# Patient Record
Sex: Female | Born: 1955 | Race: White | Hispanic: No | Marital: Married | Smoking: Former smoker
Health system: Southern US, Community
[De-identification: ages and names within clinical notes are randomized; demographics above are authoritative.]

## PROBLEM LIST (undated history)

## (undated) DIAGNOSIS — E7800 Pure hypercholesterolemia, unspecified: Secondary | ICD-10-CM

## (undated) DIAGNOSIS — N200 Calculus of kidney: Secondary | ICD-10-CM

## (undated) DIAGNOSIS — T4145XA Adverse effect of unspecified anesthetic, initial encounter: Secondary | ICD-10-CM

## (undated) DIAGNOSIS — I10 Essential (primary) hypertension: Secondary | ICD-10-CM

## (undated) DIAGNOSIS — Z8719 Personal history of other diseases of the digestive system: Secondary | ICD-10-CM

## (undated) DIAGNOSIS — T8859XA Other complications of anesthesia, initial encounter: Secondary | ICD-10-CM

## (undated) DIAGNOSIS — M1990 Unspecified osteoarthritis, unspecified site: Secondary | ICD-10-CM

## (undated) DIAGNOSIS — R609 Edema, unspecified: Secondary | ICD-10-CM

## (undated) DIAGNOSIS — R519 Headache, unspecified: Secondary | ICD-10-CM

## (undated) DIAGNOSIS — G4730 Sleep apnea, unspecified: Secondary | ICD-10-CM

## (undated) DIAGNOSIS — R51 Headache: Secondary | ICD-10-CM

## (undated) DIAGNOSIS — C801 Malignant (primary) neoplasm, unspecified: Secondary | ICD-10-CM

## (undated) DIAGNOSIS — K219 Gastro-esophageal reflux disease without esophagitis: Secondary | ICD-10-CM

## (undated) HISTORY — PX: VAGINAL HYSTERECTOMY: SUR661

## (undated) HISTORY — PX: BACK SURGERY: SHX140

## (undated) HISTORY — PX: WISDOM TOOTH EXTRACTION: SHX21

## (undated) HISTORY — PX: APPENDECTOMY: SHX54

## (undated) HISTORY — PX: FOOT SURGERY: SHX648

## (undated) HISTORY — PX: KNEE ARTHROSCOPY W/ MENISCAL REPAIR: SHX1877

## (undated) HISTORY — PX: BLADDER SUSPENSION: SHX72

## (undated) HISTORY — PX: CHOLECYSTECTOMY: SHX55

## (undated) HISTORY — PX: CARDIAC CATHETERIZATION: SHX172

---

## 2014-04-26 ENCOUNTER — Other Ambulatory Visit: Payer: Self-pay | Admitting: Physician Assistant

## 2014-04-26 NOTE — H&P (Signed)
TOTAL KNEE ADMISSION H&P  Patient is being admitted for right total knee arthroplasty.  Subjective:  Chief Complaint:right knee pain.  HPI: Lindsay [name], 58 y.o. female, has a history of pain and functional disability in the right knee due to arthritis and has failed non-surgical conservative treatments for greater than 12 weeks to includeNSAID's and/or analgesics, weight reduction as appropriate and activity modification.  Onset of symptoms was gradual, starting 10 years ago with rapidlly worsening course since that time. The patient noted prior procedures on the knee to include  arthroscopy and menisectomy on the right knee(s).  Patient currently rates pain in the right knee(s) at 8 out of 10 with activity. Patient has night pain, worsening of pain with activity and weight bearing, pain that interferes with activities of daily living and joint swelling.  Patient has evidence of subchondral cysts, subchondral sclerosis and joint space narrowing by imaging studies. There is no active infection.  There are no active problems to display for this patient.  No past medical history on file.  No past surgical history on file.   (Not in a hospital admission) Allergies not on file  History  Substance Use Topics  . Smoking status: Not on file  . Smokeless tobacco: Not on file  . Alcohol Use: Not on file    No family history on file.   Review of Systems  Constitutional: Negative.   HENT: Negative.   Eyes: Negative.   Respiratory: Negative.   Cardiovascular: Negative.   Gastrointestinal: Negative.   Genitourinary: Negative.   Musculoskeletal: Positive for joint pain.  Skin: Negative.   Neurological: Negative.   Endo/Heme/Allergies: Negative.   Psychiatric/Behavioral: Negative.     Objective:  Physical Exam  Constitutional: She is oriented to person, place, and time. She appears well-developed and well-nourished.  HENT:  Head: Normocephalic and atraumatic.  Eyes: EOM are normal.  Pupils are equal, round, and reactive to light.  Neck: Normal range of motion. Neck supple.  Cardiovascular: Normal rate, regular rhythm and normal heart sounds.  Exam reveals no gallop and no friction rub.   No murmur heard. Respiratory: Effort normal and breath sounds normal. No respiratory distress. She has no wheezes. She has no rales.  GI: Soft. Bowel sounds are normal.  Musculoskeletal:  Exogenous obesity weighing in today at 280 pounds.  Ht. 5'6", blood pressure 149/106, P 82.  Examination of her right knee reveals range of motion to 95 degrees.  Moderate patellofemoral crepitus.  Exquisite tenderness to palpation medial greater than lateral joint line.  Varus stress on the right.  Negative log roll.  Positive straight leg raise bilaterally.  Ligaments are stable throughout.  She is neurovascularly intact distally.    Neurological: She is alert and oriented to person, place, and time.  Skin: Skin is warm and dry.  Psychiatric: She has a normal mood and affect. Her behavior is normal. Judgment and thought content normal.    Vital signs in last 24 hours: @VSRANGES @  Labs:   There is no height or weight on file to calculate BMI.   Imaging Review Plain radiographs demonstrate severe degenerative joint disease of the right knee(s). The overall alignment ismild varus. The bone quality appears to be fair for age and reported activity level.  Assessment/Plan:  End stage arthritis, right knee   The patient history, physical examination, clinical judgment of the provider and imaging studies are consistent with end stage degenerative joint disease of the right knee(s) and total knee arthroplasty is deemed medically necessary.  The treatment options including medical management, injection therapy arthroscopy and arthroplasty were discussed at length. The risks and benefits of total knee arthroplasty were presented and reviewed. The risks due to aseptic loosening, infection, stiffness, patella  tracking problems, thromboembolic complications and other imponderables were discussed. The patient acknowledged the explanation, agreed to proceed with the plan and consent was signed. Patient is being admitted for inpatient treatment for surgery, pain control, PT, OT, prophylactic antibiotics, VTE prophylaxis, progressive ambulation and ADL's and discharge planning. The patient is planning to be discharged home with home health services

## 2014-05-01 ENCOUNTER — Encounter (HOSPITAL_COMMUNITY): Payer: Self-pay | Admitting: Pharmacy Technician

## 2014-05-06 ENCOUNTER — Encounter (HOSPITAL_COMMUNITY)
Admission: RE | Admit: 2014-05-06 | Discharge: 2014-05-06 | Disposition: A | Discharge status: Home / Self Care | Payer: Managed Care, Other (non HMO) | Source: Ambulatory Visit | Attending: Orthopedic Surgery | Admitting: Orthopedic Surgery

## 2014-05-06 ENCOUNTER — Other Ambulatory Visit (HOSPITAL_COMMUNITY): Payer: Self-pay | Admitting: *Deleted

## 2014-05-06 ENCOUNTER — Encounter (HOSPITAL_COMMUNITY): Payer: Self-pay

## 2014-05-06 ENCOUNTER — Encounter (HOSPITAL_COMMUNITY)
Admission: RE | Admit: 2014-05-06 | Discharge: 2014-05-06 | Disposition: A | Discharge status: Home / Self Care | Payer: Managed Care, Other (non HMO) | Source: Ambulatory Visit | Attending: Physician Assistant | Admitting: Physician Assistant

## 2014-05-06 DIAGNOSIS — Z01818 Encounter for other preprocedural examination: Secondary | ICD-10-CM | POA: Diagnosis not present

## 2014-05-06 DIAGNOSIS — M179 Osteoarthritis of knee, unspecified: Secondary | ICD-10-CM | POA: Insufficient documentation

## 2014-05-06 HISTORY — DX: Calculus of kidney: N20.0

## 2014-05-06 HISTORY — DX: Gastro-esophageal reflux disease without esophagitis: K21.9

## 2014-05-06 HISTORY — DX: Sleep apnea, unspecified: G47.30

## 2014-05-06 HISTORY — DX: Other complications of anesthesia, initial encounter: T88.59XA

## 2014-05-06 HISTORY — DX: Essential (primary) hypertension: I10

## 2014-05-06 HISTORY — DX: Personal history of other diseases of the digestive system: Z87.19

## 2014-05-06 HISTORY — DX: Headache: R51

## 2014-05-06 HISTORY — DX: Unspecified osteoarthritis, unspecified site: M19.90

## 2014-05-06 HISTORY — DX: Adverse effect of unspecified anesthetic, initial encounter: T41.45XA

## 2014-05-06 HISTORY — DX: Malignant (primary) neoplasm, unspecified: C80.1

## 2014-05-06 HISTORY — DX: Edema, unspecified: R60.9

## 2014-05-06 HISTORY — DX: Pure hypercholesterolemia, unspecified: E78.00

## 2014-05-06 HISTORY — DX: Headache, unspecified: R51.9

## 2014-05-06 LAB — URINALYSIS, ROUTINE W REFLEX MICROSCOPIC
Bilirubin Urine: NEGATIVE
Glucose, UA: NEGATIVE mg/dL
Ketones, ur: NEGATIVE mg/dL
NITRITE: NEGATIVE
PH: 7 (ref 5.0–8.0)
Protein, ur: NEGATIVE mg/dL
Specific Gravity, Urine: 1.016 (ref 1.005–1.030)
Urobilinogen, UA: 0.2 mg/dL (ref 0.0–1.0)

## 2014-05-06 LAB — PROTIME-INR
INR: 1.04 (ref 0.00–1.49)
Prothrombin Time: 13.7 seconds (ref 11.6–15.2)

## 2014-05-06 LAB — CBC WITH DIFFERENTIAL/PLATELET
BASOS ABS: 0.1 10*3/uL (ref 0.0–0.1)
Basophils Relative: 1 % (ref 0–1)
Eosinophils Absolute: 0.1 10*3/uL (ref 0.0–0.7)
Eosinophils Relative: 1 % (ref 0–5)
HCT: 42.2 % (ref 36.0–46.0)
Hemoglobin: 13.6 g/dL (ref 12.0–15.0)
Lymphocytes Relative: 35 % (ref 12–46)
Lymphs Abs: 3.4 10*3/uL (ref 0.7–4.0)
MCH: 28.1 pg (ref 26.0–34.0)
MCHC: 32.2 g/dL (ref 30.0–36.0)
MCV: 87.2 fL (ref 78.0–100.0)
Monocytes Absolute: 0.7 10*3/uL (ref 0.1–1.0)
Monocytes Relative: 7 % (ref 3–12)
NEUTROS ABS: 5.5 10*3/uL (ref 1.7–7.7)
NEUTROS PCT: 56 % (ref 43–77)
PLATELETS: 392 10*3/uL (ref 150–400)
RBC: 4.84 MIL/uL (ref 3.87–5.11)
RDW: 13.8 % (ref 11.5–15.5)
WBC: 9.7 10*3/uL (ref 4.0–10.5)

## 2014-05-06 LAB — COMPREHENSIVE METABOLIC PANEL
ALK PHOS: 124 U/L — AB (ref 39–117)
ALT: 15 U/L (ref 0–35)
AST: 17 U/L (ref 0–37)
Albumin: 3.5 g/dL (ref 3.5–5.2)
Anion gap: 13 (ref 5–15)
BILIRUBIN TOTAL: 0.3 mg/dL (ref 0.3–1.2)
BUN: 14 mg/dL (ref 6–23)
CALCIUM: 9.4 mg/dL (ref 8.4–10.5)
CHLORIDE: 99 meq/L (ref 96–112)
CO2: 26 mEq/L (ref 19–32)
Creatinine, Ser: 0.69 mg/dL (ref 0.50–1.10)
GFR calc Af Amer: >90 mL/min (ref >90)
GLUCOSE: 105 mg/dL — AB (ref 70–99)
POTASSIUM: 4.5 meq/L (ref 3.7–5.3)
SODIUM: 138 meq/L (ref 137–147)
Total Protein: 7.9 g/dL (ref 6.0–8.3)

## 2014-05-06 LAB — APTT: APTT: 28 s (ref 24–37)

## 2014-05-06 LAB — SURGICAL PCR SCREEN
MRSA, PCR: NEGATIVE
STAPHYLOCOCCUS AUREUS: POSITIVE — AB

## 2014-05-06 LAB — TYPE AND SCREEN
ABO/RH(D): O POS
Antibody Screen: NEGATIVE

## 2014-05-06 LAB — URINE MICROSCOPIC-ADD ON

## 2014-05-06 LAB — ABO/RH: ABO/RH(D): O POS

## 2014-05-06 NOTE — Progress Notes (Signed)
Mupirocin Ointment Rx called into [facility] [address] [place] for positive PCR of Staph. Pt notified and voiced understanding.

## 2014-05-06 NOTE — Pre-Procedure Instructions (Signed)
Lindsay [name]  05/06/2014   Your procedure is scheduled on:  Wednesday, May 15, 2014 at 10:45 AM.   Report to [facility] Entrance "A" Admitting Office at 8:45 AM.   Call this number if you have problems the morning of surgery: [phone]   Remember:   Do not eat food or drink liquids after midnight Tuesday, 05/14/14.   Take these medicines the morning of surgery with A SIP OF WATER: esomeprazole (Odin)   Stop Aspirin as of Wednesday, 05/08/14.    Do not wear jewelry, make-up or nail polish.  Do not wear lotions, powders, or perfumes. You may wear deodorant.  Do not shave 48 hours prior to surgery.   Do not bring valuables to the hospital.  [facility] is not responsible                  for any belongings or valuables.               Contacts, dentures or bridgework may not be worn into surgery.  Leave suitcase in the car. After surgery it may be brought to your room.  For patients admitted to the hospital, discharge time is determined by your                treatment team.               Special Instructions: [place] - Preparing for Surgery  Before surgery, you can play an important role.  Because skin is not sterile, your skin needs to be as free of germs as possible.  You can reduce the number of germs on you skin by washing with CHG (chlorahexidine gluconate) soap before surgery.  CHG is an antiseptic cleaner which kills germs and bonds with the skin to continue killing germs even after washing.  Please DO NOT use if you have an allergy to CHG or antibacterial soaps.  If your skin becomes reddened/irritated stop using the CHG and inform your nurse when you arrive at Short Stay.  Do not shave (including legs and underarms) for at least 48 hours prior to the first CHG shower.  You may shave your face.  Please follow these instructions carefully:   1.  Shower with CHG Soap the night before surgery and the                                morning of Surgery.  2.   If you choose to wash your hair, wash your hair first as usual with your       normal shampoo.  3.  After you shampoo, rinse your hair and body thoroughly to remove the                      Shampoo.  4.  Use CHG as you would any other liquid soap.  You can apply chg directly       to the skin and wash gently with scrungie or a clean washcloth.  5.  Apply the CHG Soap to your body ONLY FROM THE NECK DOWN.        Do not use on open wounds or open sores.  Avoid contact with your eyes, ears, mouth and genitals (private parts).  Wash genitals (private parts) with your normal soap.  6.  Wash thoroughly, paying special attention to the area where your surgery  will be performed.  7.  Thoroughly rinse your body with warm water from the neck down.  8.  DO NOT shower/wash with your normal soap after using and rinsing off       the CHG Soap.  9.  Pat yourself dry with a clean towel.            10.  Wear clean pajamas.            11.  Place clean sheets on your bed the night of your first shower and do not        sleep with pets.  Day of Surgery  Do not apply any lotions the morning of surgery.  Please wear clean clothes to the hospital.     Please read over the following fact sheets that you were given: Pain Booklet, Coughing and Deep Breathing, Blood Transfusion Information, MRSA Information and Surgical Site Infection Prevention

## 2014-05-06 NOTE — Progress Notes (Signed)
Pt states that last year, 03/23/14, she had an episode of "palpitations". States she went to [facility] ED and they did testing and she was told everything was ok. She states she's not had any "palpitations" since. Denies chest pain or sob. Requested EKG and any other cardiac studies from that visit from [facility]. Pt had a Nuclear Med stress test and EKG, both normal. Placed in chart.

## 2014-05-07 LAB — URINE CULTURE: Colony Count: >=100000

## 2014-05-14 MED ORDER — DEXTROSE 5 % IV SOLN
3.0000 g | INTRAVENOUS | Status: AC
  Administered 2014-05-15: 3 g via INTRAVENOUS
  Filled 2014-05-14: qty 3000

## 2014-05-14 MED ORDER — LACTATED RINGERS IV SOLN
INTRAVENOUS | Status: DC
  Administered 2014-05-15 (×2): via INTRAVENOUS

## 2014-05-14 NOTE — Anesthesia Preprocedure Evaluation (Signed)
Anesthesia Evaluation  Patient identified by MRN, date of birth, ID band Patient awake    Reviewed: Allergy & Precautions, H&P , NPO status , Patient's Chart, lab work & pertinent test results  Airway        Dental   Pulmonary former smoker (quit 1990),          Cardiovascular hypertension, Pt. on medications     Neuro/Psych    GI/Hepatic GERD-  Controlled,  Endo/Other    Renal/GU      Musculoskeletal   Abdominal   Peds  Hematology   Anesthesia Other Findings   Reproductive/Obstetrics                             Anesthesia Physical Anesthesia Plan  ASA: III  Anesthesia Plan: General   Post-op Pain Management: MAC Combined w/ Regional for Post-op pain   Induction: Intravenous  Airway Management Planned: Oral ETT  Additional Equipment:   Intra-op Plan:   Post-operative Plan: Extubation in OR  Informed Consent: I have reviewed the patients History and Physical, chart, labs and discussed the procedure including the risks, benefits and alternatives for the proposed anesthesia with the patient or authorized representative who has indicated his/her understanding and acceptance.     Plan Discussed with:   Anesthesia Plan Comments:         Anesthesia Quick Evaluation

## 2014-05-14 NOTE — Progress Notes (Signed)
Pt called confirming new arrival time

## 2014-05-15 ENCOUNTER — Encounter (HOSPITAL_COMMUNITY): Payer: Managed Care, Other (non HMO) | Admitting: Anesthesiology

## 2014-05-15 ENCOUNTER — Encounter (HOSPITAL_COMMUNITY)
Admission: RE | Disposition: A | Discharge status: Home Health | Payer: Self-pay | Source: Ambulatory Visit | Attending: Orthopedic Surgery

## 2014-05-15 ENCOUNTER — Inpatient Hospital Stay (HOSPITAL_COMMUNITY)
Admission: RE | Admit: 2014-05-15 | Discharge: 2014-05-17 | DRG: 470 | Disposition: A | Discharge status: Home Health | Payer: Managed Care, Other (non HMO) | Source: Ambulatory Visit | Attending: Orthopedic Surgery | Admitting: Orthopedic Surgery

## 2014-05-15 ENCOUNTER — Inpatient Hospital Stay (HOSPITAL_COMMUNITY): Payer: Managed Care, Other (non HMO)

## 2014-05-15 ENCOUNTER — Encounter (HOSPITAL_COMMUNITY): Payer: Self-pay | Admitting: *Deleted

## 2014-05-15 ENCOUNTER — Inpatient Hospital Stay (HOSPITAL_COMMUNITY): Payer: Managed Care, Other (non HMO) | Admitting: Anesthesiology

## 2014-05-15 DIAGNOSIS — M1711 Unilateral primary osteoarthritis, right knee: Secondary | ICD-10-CM

## 2014-05-15 DIAGNOSIS — Z96651 Presence of right artificial knee joint: Secondary | ICD-10-CM

## 2014-05-15 DIAGNOSIS — Z7982 Long term (current) use of aspirin: Secondary | ICD-10-CM

## 2014-05-15 DIAGNOSIS — Z79899 Other long term (current) drug therapy: Secondary | ICD-10-CM

## 2014-05-15 DIAGNOSIS — K219 Gastro-esophageal reflux disease without esophagitis: Secondary | ICD-10-CM | POA: Diagnosis present

## 2014-05-15 DIAGNOSIS — E780 Pure hypercholesterolemia: Secondary | ICD-10-CM | POA: Diagnosis present

## 2014-05-15 DIAGNOSIS — G4730 Sleep apnea, unspecified: Secondary | ICD-10-CM | POA: Diagnosis present

## 2014-05-15 DIAGNOSIS — M25561 Pain in right knee: Secondary | ICD-10-CM | POA: Diagnosis present

## 2014-05-15 DIAGNOSIS — Z96659 Presence of unspecified artificial knee joint: Secondary | ICD-10-CM

## 2014-05-15 DIAGNOSIS — I10 Essential (primary) hypertension: Secondary | ICD-10-CM | POA: Diagnosis present

## 2014-05-15 DIAGNOSIS — M1710 Unilateral primary osteoarthritis, unspecified knee: Secondary | ICD-10-CM | POA: Diagnosis present

## 2014-05-15 DIAGNOSIS — D62 Acute posthemorrhagic anemia: Secondary | ICD-10-CM | POA: Diagnosis not present

## 2014-05-15 DIAGNOSIS — M179 Osteoarthritis of knee, unspecified: Secondary | ICD-10-CM | POA: Diagnosis present

## 2014-05-15 HISTORY — PX: TOTAL KNEE ARTHROPLASTY: SHX125

## 2014-05-15 SURGERY — ARTHROPLASTY, KNEE, TOTAL
Anesthesia: General | Site: Knee | Laterality: Right

## 2014-05-15 MED ORDER — LIDOCAINE HCL (CARDIAC) 20 MG/ML IV SOLN
INTRAVENOUS | Status: DC | PRN
  Administered 2014-05-15: 100 mg via INTRAVENOUS

## 2014-05-15 MED ORDER — METHOCARBAMOL 1000 MG/10ML IJ SOLN
500.0000 mg | Freq: Four times a day (QID) | INTRAVENOUS | Status: DC | PRN
  Filled 2014-05-15: qty 5

## 2014-05-15 MED ORDER — GLYCOPYRROLATE 0.2 MG/ML IJ SOLN
INTRAMUSCULAR | Status: DC | PRN
  Administered 2014-05-15: 0.6 mg via INTRAVENOUS

## 2014-05-15 MED ORDER — PNEUMOCOCCAL VAC POLYVALENT 25 MCG/0.5ML IJ INJ
0.5000 mL | INJECTION | INTRAMUSCULAR | Status: AC
  Administered 2014-05-17: 0.5 mL via INTRAMUSCULAR
  Filled 2014-05-15: qty 0.5

## 2014-05-15 MED ORDER — CHLORHEXIDINE GLUCONATE 4 % EX LIQD
60.0000 mL | Freq: Once | CUTANEOUS | Status: AC
  Administered 2014-05-15: 4 via TOPICAL
  Filled 2014-05-15: qty 60

## 2014-05-15 MED ORDER — PANTOPRAZOLE SODIUM 40 MG PO TBEC
80.0000 mg | DELAYED_RELEASE_TABLET | Freq: Every day | ORAL | Status: DC
Start: 2014-05-15 — End: 2014-05-17
  Administered 2014-05-15 – 2014-05-16 (×2): 80 mg via ORAL
  Filled 2014-05-15 (×3): qty 2

## 2014-05-15 MED ORDER — ROCURONIUM BROMIDE 100 MG/10ML IV SOLN
INTRAVENOUS | Status: DC | PRN
  Administered 2014-05-15: 50 mg via INTRAVENOUS

## 2014-05-15 MED ORDER — PROMETHAZINE HCL 25 MG/ML IJ SOLN: 6.2500 mg | INTRAMUSCULAR | Status: DC | PRN

## 2014-05-15 MED ORDER — MIDAZOLAM HCL 5 MG/5ML IJ SOLN
INTRAMUSCULAR | Status: DC | PRN
  Administered 2014-05-15: 2 mg via INTRAVENOUS

## 2014-05-15 MED ORDER — ONDANSETRON HCL 4 MG/2ML IJ SOLN
INTRAMUSCULAR | Status: AC
  Filled 2014-05-15: qty 2

## 2014-05-15 MED ORDER — CEFAZOLIN SODIUM-DEXTROSE 2-3 GM-% IV SOLR
2.0000 g | Freq: Four times a day (QID) | INTRAVENOUS | Status: AC
  Administered 2014-05-15 (×2): 2 g via INTRAVENOUS
  Filled 2014-05-15 (×2): qty 50

## 2014-05-15 MED ORDER — DEXAMETHASONE SODIUM PHOSPHATE 4 MG/ML IJ SOLN
INTRAMUSCULAR | Status: DC | PRN
  Administered 2014-05-15: 4 mg via INTRAVENOUS

## 2014-05-15 MED ORDER — FENTANYL CITRATE 0.05 MG/ML IJ SOLN
INTRAMUSCULAR | Status: DC | PRN
  Administered 2014-05-15: 25 ug via INTRAVENOUS
  Administered 2014-05-15: 100 ug via INTRAVENOUS
  Administered 2014-05-15: 50 ug via INTRAVENOUS
  Administered 2014-05-15: 25 ug via INTRAVENOUS
  Administered 2014-05-15: 100 ug via INTRAVENOUS
  Administered 2014-05-15: 50 ug via INTRAVENOUS
  Administered 2014-05-15 (×2): 25 ug via INTRAVENOUS
  Administered 2014-05-15: 100 ug via INTRAVENOUS

## 2014-05-15 MED ORDER — GLYCOPYRROLATE 0.2 MG/ML IJ SOLN
INTRAMUSCULAR | Status: AC
Start: 2014-05-15 — End: 2014-05-15
  Filled 2014-05-15: qty 3

## 2014-05-15 MED ORDER — BUPIVACAINE LIPOSOME 1.3 % IJ SUSP
INTRAMUSCULAR | Status: DC | PRN
  Administered 2014-05-15: 20 mL

## 2014-05-15 MED ORDER — SODIUM CHLORIDE 0.9 % IR SOLN
Status: DC | PRN
  Administered 2014-05-15: 3000 mL

## 2014-05-15 MED ORDER — ASPIRIN EC 325 MG PO TBEC
325.0000 mg | DELAYED_RELEASE_TABLET | Freq: Every day | ORAL | Status: DC
  Administered 2014-05-16 – 2014-05-17 (×2): 325 mg via ORAL
  Filled 2014-05-15 (×3): qty 1

## 2014-05-15 MED ORDER — OXYCODONE HCL 5 MG PO TABS
ORAL_TABLET | ORAL | Status: AC
  Filled 2014-05-15: qty 2

## 2014-05-15 MED ORDER — PROPOFOL 10 MG/ML IV BOLUS
INTRAVENOUS | Status: AC
  Filled 2014-05-15: qty 20

## 2014-05-15 MED ORDER — ACETAMINOPHEN 10 MG/ML IV SOLN
INTRAVENOUS | Status: AC
  Administered 2014-05-15: 1000 mg
  Filled 2014-05-15: qty 100

## 2014-05-15 MED ORDER — METOCLOPRAMIDE HCL 10 MG PO TABS: 5.0000 mg | ORAL_TABLET | Freq: Three times a day (TID) | ORAL | Status: DC | PRN

## 2014-05-15 MED ORDER — DIPHENHYDRAMINE HCL 50 MG/ML IJ SOLN
INTRAMUSCULAR | Status: DC | PRN
  Administered 2014-05-15: 10 mg via INTRAVENOUS

## 2014-05-15 MED ORDER — BISACODYL 5 MG PO TBEC: 5.0000 mg | DELAYED_RELEASE_TABLET | Freq: Every day | ORAL | Status: DC | PRN

## 2014-05-15 MED ORDER — ACETAMINOPHEN 325 MG PO TABS
650.0000 mg | ORAL_TABLET | Freq: Four times a day (QID) | ORAL | Status: DC | PRN
  Administered 2014-05-16 – 2014-05-17 (×2): 650 mg via ORAL
  Filled 2014-05-15 (×2): qty 2

## 2014-05-15 MED ORDER — ARTIFICIAL TEARS OP OINT
TOPICAL_OINTMENT | OPHTHALMIC | Status: AC
  Filled 2014-05-15: qty 3.5

## 2014-05-15 MED ORDER — DOCUSATE SODIUM 100 MG PO CAPS
100.0000 mg | ORAL_CAPSULE | Freq: Two times a day (BID) | ORAL | Status: DC
  Administered 2014-05-15 – 2014-05-17 (×5): 100 mg via ORAL
  Filled 2014-05-15 (×5): qty 1

## 2014-05-15 MED ORDER — OXYCODONE HCL 5 MG PO TABS
5.0000 mg | ORAL_TABLET | ORAL | Status: DC | PRN
  Administered 2014-05-15 (×2): 10 mg via ORAL
  Administered 2014-05-15: 5 mg via ORAL
  Administered 2014-05-15 – 2014-05-17 (×11): 10 mg via ORAL
  Filled 2014-05-15 (×9): qty 2
  Filled 2014-05-15: qty 1
  Filled 2014-05-15 (×4): qty 2

## 2014-05-15 MED ORDER — SODIUM CHLORIDE 0.9 % IJ SOLN
INTRAMUSCULAR | Status: AC
  Filled 2014-05-15: qty 10

## 2014-05-15 MED ORDER — ROCURONIUM BROMIDE 50 MG/5ML IV SOLN
INTRAVENOUS | Status: AC
  Filled 2014-05-15: qty 1

## 2014-05-15 MED ORDER — FENTANYL CITRATE 0.05 MG/ML IJ SOLN
25.0000 ug | INTRAMUSCULAR | Status: DC | PRN
  Administered 2014-05-15 (×4): 50 ug via INTRAVENOUS

## 2014-05-15 MED ORDER — FENTANYL CITRATE 0.05 MG/ML IJ SOLN
INTRAMUSCULAR | Status: AC
  Filled 2014-05-15: qty 5

## 2014-05-15 MED ORDER — CELECOXIB 200 MG PO CAPS
200.0000 mg | ORAL_CAPSULE | Freq: Two times a day (BID) | ORAL | Status: DC
  Administered 2014-05-15 – 2014-05-17 (×5): 200 mg via ORAL
  Filled 2014-05-15 (×6): qty 1

## 2014-05-15 MED ORDER — POTASSIUM CHLORIDE IN NACL 20-0.9 MEQ/L-% IV SOLN
INTRAVENOUS | Status: DC
  Administered 2014-05-15 – 2014-05-16 (×2): via INTRAVENOUS
  Filled 2014-05-15 (×3): qty 1000

## 2014-05-15 MED ORDER — DEXAMETHASONE SODIUM PHOSPHATE 4 MG/ML IJ SOLN
INTRAMUSCULAR | Status: AC
  Filled 2014-05-15: qty 1

## 2014-05-15 MED ORDER — METHOCARBAMOL 500 MG PO TABS
500.0000 mg | ORAL_TABLET | Freq: Four times a day (QID) | ORAL | Status: DC | PRN
  Administered 2014-05-15 – 2014-05-17 (×7): 500 mg via ORAL
  Filled 2014-05-15 (×6): qty 1

## 2014-05-15 MED ORDER — LISINOPRIL-HYDROCHLOROTHIAZIDE 20-25 MG PO TABS: 1.0000 | ORAL_TABLET | Freq: Every day | ORAL | Status: DC

## 2014-05-15 MED ORDER — FENTANYL CITRATE 0.05 MG/ML IJ SOLN
INTRAMUSCULAR | Status: AC
  Filled 2014-05-15: qty 2

## 2014-05-15 MED ORDER — ONDANSETRON HCL 4 MG/2ML IJ SOLN
INTRAMUSCULAR | Status: DC | PRN
  Administered 2014-05-15: 4 mg via INTRAVENOUS

## 2014-05-15 MED ORDER — BISACODYL 5 MG PO TBEC: 5.0000 mg | DELAYED_RELEASE_TABLET | Freq: Every day | ORAL | Status: AC | PRN

## 2014-05-15 MED ORDER — SUCCINYLCHOLINE CHLORIDE 20 MG/ML IJ SOLN
INTRAMUSCULAR | Status: AC
  Filled 2014-05-15: qty 1

## 2014-05-15 MED ORDER — PHENYLEPHRINE HCL 10 MG/ML IJ SOLN
INTRAMUSCULAR | Status: DC | PRN
  Administered 2014-05-15 (×6): 80 ug via INTRAVENOUS

## 2014-05-15 MED ORDER — ONDANSETRON HCL 4 MG/2ML IJ SOLN: 4.0000 mg | Freq: Four times a day (QID) | INTRAMUSCULAR | Status: DC | PRN

## 2014-05-15 MED ORDER — MEPERIDINE HCL 25 MG/ML IJ SOLN: 6.2500 mg | INTRAMUSCULAR | Status: DC | PRN

## 2014-05-15 MED ORDER — METHOCARBAMOL 500 MG PO TABS: 500.0000 mg | ORAL_TABLET | Freq: Four times a day (QID) | ORAL | Status: AC

## 2014-05-15 MED ORDER — NEOSTIGMINE METHYLSULFATE 10 MG/10ML IV SOLN
INTRAVENOUS | Status: DC | PRN
  Administered 2014-05-15: 4 mg via INTRAVENOUS

## 2014-05-15 MED ORDER — ASPIRIN EC 325 MG PO TBEC: 325.0000 mg | DELAYED_RELEASE_TABLET | Freq: Every day | ORAL | Status: AC

## 2014-05-15 MED ORDER — FENTANYL CITRATE 0.05 MG/ML IJ SOLN
INTRAMUSCULAR | Status: AC
  Administered 2014-05-15: 50 ug via INTRAVENOUS
  Filled 2014-05-15: qty 2

## 2014-05-15 MED ORDER — METHOCARBAMOL 500 MG PO TABS
ORAL_TABLET | ORAL | Status: AC
  Filled 2014-05-15: qty 1

## 2014-05-15 MED ORDER — SODIUM CHLORIDE 0.9 % IJ SOLN
INTRAMUSCULAR | Status: DC | PRN
  Administered 2014-05-15: 40 mL

## 2014-05-15 MED ORDER — NEOSTIGMINE METHYLSULFATE 10 MG/10ML IV SOLN
INTRAVENOUS | Status: AC
  Filled 2014-05-15: qty 1

## 2014-05-15 MED ORDER — CHLORHEXIDINE GLUCONATE 4 % EX LIQD
60.0000 mL | Freq: Once | CUTANEOUS | Status: DC
Start: 2014-05-15 — End: 2014-05-15
  Filled 2014-05-15: qty 60

## 2014-05-15 MED ORDER — PHENOL 1.4 % MT LIQD: 1.0000 | OROMUCOSAL | Status: DC | PRN

## 2014-05-15 MED ORDER — INFLUENZA VAC SPLIT QUAD 0.5 ML IM SUSY
0.5000 mL | PREFILLED_SYRINGE | INTRAMUSCULAR | Status: DC
  Filled 2014-05-15: qty 0.5

## 2014-05-15 MED ORDER — EPHEDRINE SULFATE 50 MG/ML IJ SOLN
INTRAMUSCULAR | Status: AC
  Filled 2014-05-15: qty 1

## 2014-05-15 MED ORDER — ACETAMINOPHEN 650 MG RE SUPP: 650.0000 mg | Freq: Four times a day (QID) | RECTAL | Status: DC | PRN

## 2014-05-15 MED ORDER — OXYCODONE-ACETAMINOPHEN 5-325 MG PO TABS: 1.0000 | ORAL_TABLET | ORAL | Status: AC | PRN

## 2014-05-15 MED ORDER — ONDANSETRON HCL 4 MG PO TABS: 4.0000 mg | ORAL_TABLET | Freq: Four times a day (QID) | ORAL | Status: DC | PRN

## 2014-05-15 MED ORDER — HYDROCHLOROTHIAZIDE 25 MG PO TABS
25.0000 mg | ORAL_TABLET | Freq: Every day | ORAL | Status: DC
  Administered 2014-05-15 – 2014-05-17 (×3): 25 mg via ORAL
  Filled 2014-05-15 (×3): qty 1

## 2014-05-15 MED ORDER — PROPOFOL 10 MG/ML IV BOLUS
INTRAVENOUS | Status: DC | PRN
  Administered 2014-05-15 (×2): 20 mg via INTRAVENOUS
  Administered 2014-05-15: 120 mg via INTRAVENOUS
  Administered 2014-05-15: 30 mg via INTRAVENOUS

## 2014-05-15 MED ORDER — ZOLPIDEM TARTRATE 5 MG PO TABS
5.0000 mg | ORAL_TABLET | Freq: Every evening | ORAL | Status: DC | PRN
  Administered 2014-05-16: 5 mg via ORAL
  Filled 2014-05-15: qty 1

## 2014-05-15 MED ORDER — DIPHENHYDRAMINE HCL 50 MG/ML IJ SOLN
INTRAMUSCULAR | Status: AC
  Filled 2014-05-15: qty 1

## 2014-05-15 MED ORDER — METOCLOPRAMIDE HCL 5 MG/ML IJ SOLN: 5.0000 mg | Freq: Three times a day (TID) | INTRAMUSCULAR | Status: DC | PRN

## 2014-05-15 MED ORDER — PHENYLEPHRINE 40 MCG/ML (10ML) SYRINGE FOR IV PUSH (FOR BLOOD PRESSURE SUPPORT)
PREFILLED_SYRINGE | INTRAVENOUS | Status: AC
  Filled 2014-05-15: qty 10

## 2014-05-15 MED ORDER — LIDOCAINE HCL (CARDIAC) 20 MG/ML IV SOLN
INTRAVENOUS | Status: AC
  Filled 2014-05-15: qty 5

## 2014-05-15 MED ORDER — MENTHOL 3 MG MT LOZG: 1.0000 | LOZENGE | OROMUCOSAL | Status: DC | PRN

## 2014-05-15 MED ORDER — DIPHENHYDRAMINE HCL 12.5 MG/5ML PO ELIX: 12.5000 mg | ORAL_SOLUTION | ORAL | Status: DC | PRN

## 2014-05-15 MED ORDER — ONDANSETRON HCL 4 MG PO TABS: 4.0000 mg | ORAL_TABLET | Freq: Three times a day (TID) | ORAL | Status: AC | PRN

## 2014-05-15 MED ORDER — LISINOPRIL 20 MG PO TABS
20.0000 mg | ORAL_TABLET | Freq: Every day | ORAL | Status: DC
  Administered 2014-05-15 – 2014-05-17 (×3): 20 mg via ORAL
  Filled 2014-05-15 (×3): qty 1

## 2014-05-15 MED ORDER — MIDAZOLAM HCL 2 MG/2ML IJ SOLN
INTRAMUSCULAR | Status: AC
  Filled 2014-05-15: qty 2

## 2014-05-15 SURGICAL SUPPLY — 66 items
BANDAGE ELASTIC 4 VELCRO ST LF (GAUZE/BANDAGES/DRESSINGS) ×3 IMPLANT
BANDAGE ELASTIC 6 VELCRO ST LF (GAUZE/BANDAGES/DRESSINGS) ×3 IMPLANT
BANDAGE ESMARK 6X9 LF (GAUZE/BANDAGES/DRESSINGS) ×1 IMPLANT
BENZOIN TINCTURE PRP APPL 2/3 (GAUZE/BANDAGES/DRESSINGS) ×3 IMPLANT
BLADE SAG 18X100X1.27 (BLADE) ×6 IMPLANT
BNDG ESMARK 6X9 LF (GAUZE/BANDAGES/DRESSINGS) ×3
BOWL SMART MIX CTS (DISPOSABLE) ×3 IMPLANT
CEMENT BONE SIMPLEX SPEEDSET (Cement) ×6 IMPLANT
CLOSURE STERI-STRIP 1/4X4 (GAUZE/BANDAGES/DRESSINGS) ×3 IMPLANT
CLOSURE WOUND 1/2 X4 (GAUZE/BANDAGES/DRESSINGS) ×2
COVER SURGICAL LIGHT HANDLE (MISCELLANEOUS) ×3 IMPLANT
CUFF TOURNIQUET SINGLE 34IN LL (TOURNIQUET CUFF) ×3 IMPLANT
DRAPE EXTREMITY T 121X128X90 (DRAPE) ×3 IMPLANT
DRAPE PROXIMA HALF (DRAPES) ×3 IMPLANT
DRAPE U-SHAPE 47X51 STRL (DRAPES) ×3 IMPLANT
DRSG PAD ABDOMINAL 8X10 ST (GAUZE/BANDAGES/DRESSINGS) ×3 IMPLANT
DURAPREP 26ML APPLICATOR (WOUND CARE) ×6 IMPLANT
ELECT CAUTERY BLADE 6.4 (BLADE) ×3 IMPLANT
ELECT REM PT RETURN 9FT ADLT (ELECTROSURGICAL) ×3
ELECTRODE REM PT RTRN 9FT ADLT (ELECTROSURGICAL) ×1 IMPLANT
EVACUATOR 1/8 PVC DRAIN (DRAIN) ×3 IMPLANT
FACESHIELD WRAPAROUND (MASK) ×6 IMPLANT
GAUZE SPONGE 4X4 12PLY STRL (GAUZE/BANDAGES/DRESSINGS) ×3 IMPLANT
GLOVE BIOGEL PI IND STRL 7.0 (GLOVE) ×2 IMPLANT
GLOVE BIOGEL PI INDICATOR 7.0 (GLOVE) ×4
GLOVE ECLIPSE 6.5 STRL STRAW (GLOVE) ×6 IMPLANT
GLOVE ORTHO TXT STRL SZ7.5 (GLOVE) ×3 IMPLANT
GOWN STRL REUS W/ TWL LRG LVL3 (GOWN DISPOSABLE) ×1 IMPLANT
GOWN STRL REUS W/ TWL XL LVL3 (GOWN DISPOSABLE) ×1 IMPLANT
GOWN STRL REUS W/TWL LRG LVL3 (GOWN DISPOSABLE) ×2
GOWN STRL REUS W/TWL XL LVL3 (GOWN DISPOSABLE) ×2
HANDPIECE INTERPULSE COAX TIP (DISPOSABLE) ×2
IMMOBILIZER KNEE 22 UNIV (SOFTGOODS) ×3 IMPLANT
IMMOBILIZER KNEE 24 THIGH 36 (MISCELLANEOUS) IMPLANT
IMMOBILIZER KNEE 24 UNIV (MISCELLANEOUS)
KIT BASIN OR (CUSTOM PROCEDURE TRAY) ×3 IMPLANT
KIT ROOM TURNOVER OR (KITS) ×3 IMPLANT
KNEE/VIT E POLY LINER LEVEL 1B ×3 IMPLANT
MANIFOLD NEPTUNE II (INSTRUMENTS) ×3 IMPLANT
NEEDLE 18GX1X1/2 (RX/OR ONLY) (NEEDLE) ×3 IMPLANT
NEEDLE 25GX 5/8IN NON SAFETY (NEEDLE) ×3 IMPLANT
NS IRRIG 1000ML POUR BTL (IV SOLUTION) ×3 IMPLANT
PACK TOTAL JOINT (CUSTOM PROCEDURE TRAY) ×3 IMPLANT
PAD ARMBOARD 7.5X6 YLW CONV (MISCELLANEOUS) ×6 IMPLANT
PAD CAST 4YDX4 CTTN HI CHSV (CAST SUPPLIES) ×1 IMPLANT
PADDING CAST ABS 4INX4YD NS (CAST SUPPLIES) ×2
PADDING CAST ABS COTTON 4X4 ST (CAST SUPPLIES) ×1 IMPLANT
PADDING CAST COTTON 4X4 STRL (CAST SUPPLIES) ×2
PADDING CAST COTTON 6X4 STRL (CAST SUPPLIES) ×3 IMPLANT
SET HNDPC FAN SPRY TIP SCT (DISPOSABLE) ×1 IMPLANT
SPONGE GAUZE 4X4 12PLY STER LF (GAUZE/BANDAGES/DRESSINGS) ×3 IMPLANT
STRIP CLOSURE SKIN 1/2X4 (GAUZE/BANDAGES/DRESSINGS) ×4 IMPLANT
SUCTION FRAZIER TIP 10 FR DISP (SUCTIONS) ×3 IMPLANT
SUT ETHILON 2 0 FS 18 (SUTURE) ×3 IMPLANT
SUT MNCRL AB 4-0 PS2 18 (SUTURE) ×3 IMPLANT
SUT VIC AB 0 CT1 27 (SUTURE)
SUT VIC AB 0 CT1 27XBRD ANBCTR (SUTURE) IMPLANT
SUT VIC AB 1 CT1 27 (SUTURE) ×4
SUT VIC AB 1 CT1 27XBRD ANBCTR (SUTURE) ×2 IMPLANT
SUT VIC AB 2-0 CT1 27 (SUTURE) ×4
SUT VIC AB 2-0 CT1 TAPERPNT 27 (SUTURE) ×2 IMPLANT
SYR 50ML LL SCALE MARK (SYRINGE) ×3 IMPLANT
SYR CONTROL 10ML LL (SYRINGE) ×3 IMPLANT
TOWEL OR 17X24 6PK STRL BLUE (TOWEL DISPOSABLE) ×3 IMPLANT
TOWEL OR 17X26 10 PK STRL BLUE (TOWEL DISPOSABLE) ×3 IMPLANT
WATER STERILE IRR 1000ML POUR (IV SOLUTION) ×6 IMPLANT

## 2014-05-15 NOTE — H&P (View-Only) (Signed)
TOTAL KNEE ADMISSION H&P  Patient is being admitted for right total knee arthroplasty.  Subjective:  Chief Complaint:right knee pain.  HPI: Lindsay [name], 58 y.o. female, has a history of pain and functional disability in the right knee due to arthritis and has failed non-surgical conservative treatments for greater than 12 weeks to includeNSAID's and/or analgesics, weight reduction as appropriate and activity modification.  Onset of symptoms was gradual, starting 10 years ago with rapidlly worsening course since that time. The patient noted prior procedures on the knee to include  arthroscopy and menisectomy on the right knee(s).  Patient currently rates pain in the right knee(s) at 8 out of 10 with activity. Patient has night pain, worsening of pain with activity and weight bearing, pain that interferes with activities of daily living and joint swelling.  Patient has evidence of subchondral cysts, subchondral sclerosis and joint space narrowing by imaging studies. There is no active infection.  There are no active problems to display for this patient.  No past medical history on file.  No past surgical history on file.   (Not in a hospital admission) Allergies not on file  History  Substance Use Topics  . Smoking status: Not on file  . Smokeless tobacco: Not on file  . Alcohol Use: Not on file    No family history on file.   Review of Systems  Constitutional: Negative.   HENT: Negative.   Eyes: Negative.   Respiratory: Negative.   Cardiovascular: Negative.   Gastrointestinal: Negative.   Genitourinary: Negative.   Musculoskeletal: Positive for joint pain.  Skin: Negative.   Neurological: Negative.   Endo/Heme/Allergies: Negative.   Psychiatric/Behavioral: Negative.     Objective:  Physical Exam  Constitutional: She is oriented to person, place, and time. She appears well-developed and well-nourished.  HENT:  Head: Normocephalic and atraumatic.  Eyes: EOM are normal.  Pupils are equal, round, and reactive to light.  Neck: Normal range of motion. Neck supple.  Cardiovascular: Normal rate, regular rhythm and normal heart sounds.  Exam reveals no gallop and no friction rub.   No murmur heard. Respiratory: Effort normal and breath sounds normal. No respiratory distress. She has no wheezes. She has no rales.  GI: Soft. Bowel sounds are normal.  Musculoskeletal:  Exogenous obesity weighing in today at 280 pounds.  Ht. 5'6", blood pressure 149/106, P 82.  Examination of her right knee reveals range of motion to 95 degrees.  Moderate patellofemoral crepitus.  Exquisite tenderness to palpation medial greater than lateral joint line.  Varus stress on the right.  Negative log roll.  Positive straight leg raise bilaterally.  Ligaments are stable throughout.  She is neurovascularly intact distally.    Neurological: She is alert and oriented to person, place, and time.  Skin: Skin is warm and dry.  Psychiatric: She has a normal mood and affect. Her behavior is normal. Judgment and thought content normal.    Vital signs in last 24 hours: @VSRANGES @  Labs:   There is no height or weight on file to calculate BMI.   Imaging Review Plain radiographs demonstrate severe degenerative joint disease of the right knee(s). The overall alignment ismild varus. The bone quality appears to be fair for age and reported activity level.  Assessment/Plan:  End stage arthritis, right knee   The patient history, physical examination, clinical judgment of the provider and imaging studies are consistent with end stage degenerative joint disease of the right knee(s) and total knee arthroplasty is deemed medically necessary.  The treatment options including medical management, injection therapy arthroscopy and arthroplasty were discussed at length. The risks and benefits of total knee arthroplasty were presented and reviewed. The risks due to aseptic loosening, infection, stiffness, patella  tracking problems, thromboembolic complications and other imponderables were discussed. The patient acknowledged the explanation, agreed to proceed with the plan and consent was signed. Patient is being admitted for inpatient treatment for surgery, pain control, PT, OT, prophylactic antibiotics, VTE prophylaxis, progressive ambulation and ADL's and discharge planning. The patient is planning to be discharged home with home health services

## 2014-05-15 NOTE — Progress Notes (Signed)
RT note: CPAP set up in room for h/s use.

## 2014-05-15 NOTE — Interval H&P Note (Signed)
History and Physical Interval Note:  05/15/2014 8:31 AM  Lindsay [name]  has presented today for surgery, with the diagnosis of djd right knee  The various methods of treatment have been discussed with the patient and family. After consideration of risks, benefits and other options for treatment, the patient has consented to  Procedure(s): RIGHT TOTAL KNEE ARTHROPLASTY (Right) as a surgical intervention .  The patient's history has been reviewed, patient examined, no change in status, stable for surgery.  I have reviewed the patient's chart and labs.  Questions were answered to the patient's satisfaction.     [name] F

## 2014-05-15 NOTE — Evaluation (Signed)
Occupational Therapy Evaluation Patient Details Name: Lindsay [name] MRN: [id number] DOB: 04-21-1956 Today's Date: 05/15/2014    History of Present Illness 58 y.o. s/p Rt TKA.   Clinical Impression   Pt s/p above. Pt independent with ADLs, PTA. Feel pt will benefit from acute OT to increase independence with ADLs, PTA. Plan to practice tub transfer next session and LB ADLs.    Follow Up Recommendations  No OT follow up;Supervision - Intermittent    Equipment Recommendations  3 in 1 bedside comode;Tub/shower seat;Other (comment) (FLAT shower seat)    Recommendations for Other Services       Precautions / Restrictions Precautions Precautions: Fall;Knee Precaution Booklet Issued: No Precaution Comments: educated on precautions Required Braces or Orthoses: Knee Immobilizer - Right Restrictions Weight Bearing Restrictions: Yes RLE Weight Bearing: Weight bearing as tolerated      Mobility Bed Mobility Overal bed mobility: Needs Assistance Bed Mobility: Supine to Sit     Supine to sit: Min assist     General bed mobility comments: assist with RLE. cues for technique  Transfers Overall transfer level: Needs assistance Equipment used: Rolling walker (2 wheeled) Transfers: Sit to/from [facility] Sit to Stand: Min assist Stand pivot transfers: Mod assist       General transfer comment: cues for technique.    Balance                                            ADL Overall ADL's : Needs assistance/impaired                     Lower Body Dressing: Sit to/from stand;Moderate assistance   Toilet Transfer: Moderate assistance;RW;Stand-pivot;BSC   Toileting- Clothing Manipulation and Hygiene: Min guard;Sit to/from stand       Functional mobility during ADLs: Rolling walker;Moderate assistance General ADL Comments: Educated on AE. Educated on tub transfer techniques and options for shower chair. Recommended pt not step over  tub any time soon. Educated on dressing technique. Educated on safety (safe shoewear, use of bag on walker, rugs, sitting for most of LB ADLs).  Explained benefit of reaching down to donn/doff socks as it allows right knee to bend. Explained footsie roll and purpose. OT donned/doffed knee immobilizer. Pt does not normally wear socks. Spouse asking about home health and OT explained.      Vision                     Perception     Praxis      Pertinent Vitals/Pain Pain Assessment: 0-10 Pain Score: 4  Pain Location: right knee Pain Intervention(s): Repositioned;Monitored during session     Hand Dominance     Extremity/Trunk Assessment Upper Extremity Assessment Upper Extremity Assessment: Overall WFL for tasks assessed   Lower Extremity Assessment Lower Extremity Assessment: Defer to PT evaluation       Communication Communication Communication: No difficulties   Cognition Arousal/Alertness: Awake/alert Behavior During Therapy: Anxious Overall Cognitive Status: Within Functional Limits for tasks assessed                     General Comments       Exercises       Shoulder Instructions      Home Living Family/patient expects to be discharged to:: Private residence Living Arrangements: Spouse/significant other Available Help at Discharge: Family;Available 24  hours/day Type of Home: House Home Access: Stairs to enter [employer] of Steps: 3 Entrance Stairs-Rails: Right;Left;Can reach both Home Layout: One level     Bathroom Shower/Tub: Teacher, early years/pre: Standard                Prior Functioning/Environment Level of Independence: Independent             OT Diagnosis: Acute pain   OT Problem List: Decreased strength;Decreased range of motion;Decreased activity tolerance;Impaired balance (sitting and/or standing);Decreased knowledge of use of DME or AE;Decreased knowledge of precautions;Pain;Obesity   OT  Treatment/Interventions: Self-care/ADL training;DME and/or AE instruction;Therapeutic activities;Patient/family education;Balance training    OT Goals(Current goals can be found in the care plan section) Acute Rehab OT Goals Patient Stated Goal: not stated OT Goal Formulation: With patient Time For Goal Achievement: 05/22/14 Potential to Achieve Goals: Good ADL Goals Pt Will Perform Lower Body Dressing: with set-up;sit to/from stand;with adaptive equipment;with supervision Pt Will Transfer to Toilet: with supervision;ambulating (3 in 1 over commode) Pt Will Perform Tub/Shower Transfer: Tub transfer;with supervision;ambulating;shower seat;rolling walker  OT Frequency: Min 2X/week   Barriers to D/C:            Co-evaluation              End of Session Equipment Utilized During Treatment: Gait belt;Rolling walker;Right knee immobilizer CPM Right Knee CPM Right Knee: Off  Activity Tolerance: Patient tolerated treatment well Patient left: in chair;with call bell/phone within reach;with family/visitor present   Time: 9323-5573 OT Time Calculation (min): 35 min Charges:  OT General Charges $OT Visit: 1 Procedure OT Evaluation $Initial OT Evaluation Tier I: 1 Procedure OT Treatments $Self Care/Home Management : 8-22 mins G-CodesBenito Mccreedy OTR/L [phone] 05/15/2014, 6:01 PM

## 2014-05-15 NOTE — Progress Notes (Signed)
Orthopedic Tech Progress Note Patient Details:  Lindsay [name] November 11, 1955 [id number] Applied CPM to RLE.  Applied OHF with trapeze to pt.'s bed.  Gave Bone Foam to pt.'s nurse. CPM Right Knee CPM Right Knee: On Right Knee Flexion (Degrees): 90 Right Knee Extension (Degrees): 0   Darrol Poke 05/15/2014, 12:56 PM

## 2014-05-15 NOTE — Transfer of Care (Signed)
Immediate Anesthesia Transfer of Care Note  Patient: Lindsay [name]  Procedure(s) Performed: Procedure(s): RIGHT TOTAL KNEE ARTHROPLASTY (Right)  Patient Location: PACU  Anesthesia Type:General  Level of Consciousness: awake, alert  and oriented  Airway & Oxygen Therapy: Patient Spontanous Breathing and Patient connected to nasal cannula oxygen  Post-op Assessment: Report given to PACU RN, Post -op Vital signs reviewed and stable and Patient moving all extremities X 4  Post vital signs: Reviewed and stable  Complications: No apparent anesthesia complications

## 2014-05-15 NOTE — Anesthesia Procedure Notes (Addendum)
Procedure Name: Intubation Date/Time: 05/15/2014 9:58 AM Performed by: Carola Frost Pre-anesthesia Checklist: Patient identified, Timeout performed, Emergency Drugs available, Suction available and Patient being monitored Patient Re-evaluated:Patient Re-evaluated prior to inductionOxygen Delivery Method: Circle system utilized Preoxygenation: Pre-oxygenation with 100% oxygen Intubation Type: IV induction Ventilation: Mask ventilation without difficulty Laryngoscope Size: Mac and 3 Grade View: Grade I Tube size: 7.0 mm Number of attempts: 1 Placement Confirmation: CO2 detector,  positive ETCO2,  ETT inserted through vocal cords under direct vision and breath sounds checked- equal and bilateral Secured at: 22 cm Tube secured with: Tape Dental Injury: Teeth and Oropharynx as per pre-operative assessment  Comments: DLX1 by Dr. Tresa Moore, atraumatic intubation.

## 2014-05-15 NOTE — Anesthesia Postprocedure Evaluation (Signed)
  Anesthesia Post-op Note  Patient: Lindsay [name]  Procedure(s) Performed: Procedure(s): RIGHT TOTAL KNEE ARTHROPLASTY (Right)  Patient Location: PACU  Anesthesia Type:General  Level of Consciousness: awake and alert   Airway and Oxygen Therapy: Patient Spontanous Breathing and Patient connected to nasal cannula oxygen  Post-op Pain: mild  Post-op Assessment: Post-op Vital signs reviewed, Patient's Cardiovascular Status Stable and Respiratory Function Stable  Post-op Vital Signs: Reviewed and stable  Last Vitals:  Filed Vitals:   05/15/14 1200  BP:   Pulse: 86  Temp:   Resp: 14    Complications: No apparent anesthesia complications

## 2014-05-15 NOTE — Discharge Instructions (Signed)
Total Knee Replacement, Care After Refer to this sheet in the next few weeks. These instructions provide you with information on caring for yourself after your procedure. Your health care provider also may give you specific instructions. Your treatment has been planned according to the most current medical practices, but problems sometimes occur. Call your health care provider if you have any problems or questions after your procedure. HOME CARE INSTRUCTIONS   Weight bearing as tolerated.  Take Aspirin 325 mg 1 tab a day for the next 30 days to prevent blood clots.  Change bandages daily starting on Saturday.  May shower on Monday, but do not soak incision.  May apply ice for up to 20 minutes at a time for pain and swelling.  Follow up appointment in two weeks.    See a physical therapist as directed by your health care provider.  Take medicines only as directed by your health care provider.  Avoid lifting or driving until you are instructed otherwise.  If you have been sent home with a continuous passive motion machine, use it as directed by your health care provider. SEEK MEDICAL CARE IF:  You have difficulty breathing.  You have drainage, redness, swelling, or pain at your incision site.  You have a bad smell coming from your incision site.  You have persistent bleeding from your incision site.  Your incision breaks open after sutures (stitches) or staples have been removed.  You have a fever. SEEK IMMEDIATE MEDICAL CARE IF:   You have a rash.  You have pain or swelling in your calf or thigh.  You have shortness of breath or chest pain.  Your range of motion in your knee is decreasing rather than increasing. MAKE SURE YOU:   Understand these instructions.  Will watch your condition.  Will get help right away if you are not doing well or get worse. Document Released: 01/22/2005 Document Revised: 11/19/2013 Document Reviewed: 08/24/2011 [facility] Patient Information 2015  [place], [place]. This information is not intended to replace advice given to you by your health care provider. Make sure you discuss any questions you have with your health care provider.

## 2014-05-15 NOTE — Discharge Summary (Addendum)
Patient ID: Lindsay [name] MRN: [id number] DOB/AGE: 01/25/1956 58 y.o.  Admit date: 05/15/2014 Discharge date: 05/17/2014  Admission Diagnoses:  Active Problems:   DJD (degenerative joint disease) of knee   Discharge Diagnoses:  Same  Past Medical History  Diagnosis Date  . Hypertension   . GERD (gastroesophageal reflux disease)   . High cholesterol   . Edema     right foot   . Sleep apnea     wears c-pap  . Kidney stones   . H/O hiatal hernia   . Headache     migraines in the past  . Arthritis     osteoarthritis, knee and back  . Cancer     basal cell on shoulder  . Complication of anesthesia     vomited 1 time after appendectomy ( 20+ years ago)    Surgeries: Procedure(s): RIGHT TOTAL KNEE ARTHROPLASTY on 05/15/2014   Consultants:    Discharged Condition: Improved  Hospital Course: Lindsay [name] is an 58 y.o. female who was admitted 05/15/2014 for operative treatment of primary osteoarthritis of the right knee. Patient has severe unremitting pain that affects sleep, daily activities, and work/hobbies. After pre-op clearance the patient was taken to the operating room on 05/15/2014 and underwent  Procedure(s): RIGHT TOTAL KNEE ARTHROPLASTY.  During operative intervention of her right knee, we found a suspicious appearing mole to the medial aspect of her distal thigh.  Because she has a hx of a similar appearing mole on her left shoulder that turned out to be basal cell carcinoma, we removed this growth and sent it to pathology.  Patient had a pre-op hgb of 13.6.  She developed ABLA on POD #1 with a hgb of 10.8.  She is asymptomatic but we will continue to follow.    Patient was given perioperative antibiotics:     Anti-infectives   Start     Dose/Rate Route Frequency Ordered Stop   05/15/14 1600  ceFAZolin (ANCEF) IVPB 2 g/50 mL premix     2 g 100 mL/hr over 30 Minutes Intravenous Every 6 hours 05/15/14 1427 05/15/14 2205   05/15/14 0600  ceFAZolin (ANCEF) 3 g  in dextrose 5 % 50 mL IVPB     3 g 160 mL/hr over 30 Minutes Intravenous On call to O.R. 05/14/14 1419 05/15/14 1005       Patient was given sequential compression devices, early ambulation, and chemoprophylaxis to prevent DVT.  Patient benefited maximally from hospital stay and there were no complications.    Recent vital signs:  Patient Vitals for the past 24 hrs:  BP Temp Temp src Pulse Resp SpO2  05/17/14 0525 99/56 mmHg 98.1 F (36.7 C) - 83 18 94 %  05/17/14 0400 - - - - 17 -  05/17/14 0000 - - - - 17 -  05/16/14 2328 - - - 82 18 92 %  05/16/14 2121 105/49 mmHg 98.6 F (37 C) - 83 18 92 %  05/16/14 2000 - - - - 16 -  05/16/14 1559 104/62 mmHg 98.4 F (36.9 C) Oral 93 18 96 %  05/16/14 1110 105/56 mmHg - - - - -     Recent laboratory studies:   Recent Labs  05/16/14 0507 05/17/14 0550  WBC 11.8* 10.2  HGB 10.8* 10.8*  HCT 33.8* 34.3*  PLT 309 278  NA 135* 136*  K 3.8 3.7  CL 97 97  CO2 27 27  BUN 9 10  CREATININE 0.58 0.69  GLUCOSE 112* 109*  CALCIUM 8.7 8.7     Discharge Medications:     Medication List    STOP taking these medications       aspirin 81 MG tablet  Replaced by:  aspirin EC 325 MG tablet      TAKE these medications       aspirin EC 325 MG tablet  Take 1 tablet (325 mg total) by mouth daily.     bisacodyl 5 MG EC tablet  Commonly known as:  DULCOLAX  Take 1 tablet (5 mg total) by mouth daily as needed for moderate constipation.     esomeprazole 40 MG capsule  Commonly known as:  NEXIUM  Take 40 mg by mouth every other day.     lisinopril-hydrochlorothiazide 20-25 MG per tablet  Commonly known as:  PRINZIDE,ZESTORETIC  Take 1 tablet by mouth daily.     methocarbamol 500 MG tablet  Commonly known as:  ROBAXIN  Take 1 tablet (500 mg total) by mouth 4 (four) times daily.     ondansetron 4 MG tablet  Commonly known as:  ZOFRAN  Take 1 tablet (4 mg total) by mouth every 8 (eight) hours as needed for nausea or vomiting.      oxyCODONE-acetaminophen 5-325 MG per tablet  Commonly known as:  ROXICET  Take 1-2 tablets by mouth every 4 (four) hours as needed.        Diagnostic Studies: Dg Chest 2 View  05/06/2014   CLINICAL DATA:  Preop right total knee replacement  EXAM: CHEST  2 VIEW  COMPARISON:  CT 02/22/2014, chest x-ray 02/22/2014  FINDINGS: Heart size and vascularity are normal. Negative for pneumonia or effusion.  15 mm density left midlung corresponds to a calcified pleural plaque on CT. Negative for mass lesion.  IMPRESSION: Calcified pleural plaque projects over the left midlung  No acute cardiopulmonary abnormality.   Electronically Signed   By: Franchot Gallo M.D.   On: 05/06/2014 11:52    Disposition: Final discharge disposition not confirmed  Discharge Instructions   CPM    Complete by:  As directed   Continuous passive motion machine (CPM):      Use the CPM from 0- to 60 for 6 hours per day.      You may increase by 10 per day.  You may break it up into 2 or 3 sessions per day.      Use CPM for 2-3 weeks or until you are told to stop.     Call MD / Call 911    Complete by:  As directed   If you experience chest pain or shortness of breath, CALL 911 and be transported to the hospital emergency room.  If you develope a fever above 101 F, pus (white drainage) or increased drainage or redness at the wound, or calf pain, call your surgeon's office.     Change dressing    Complete by:  As directed   Change dressing on Saturday, then change the dressing daily with sterile 4 x 4 inch gauze dressing and apply TED hose.  You may clean the incision with alcohol prior to redressing.     Constipation Prevention    Complete by:  As directed   Drink plenty of fluids.  Prune juice may be helpful.  You may use a stool softener, such as Colace (over the counter) 100 mg twice a day.  Use MiraLax (over the counter) for constipation as needed.     Diet - low sodium heart healthy  Complete by:  As directed       Discharge instructions    Complete by:  As directed   Weight bearing as tolerated.  Take Aspirin 325 mg 1 tab a day for the next 30 days to prevent blood clots.  Change bandage daily starting on Saturday.  May shower on Monday, but do not soak incision.  May apply ice for up to 20 minutes at a time for pain and swelling.  Follow up appointment in two weeks.     Do not put a pillow under the knee. Place it under the heel.    Complete by:  As directed   Place gray foam under operative heel when in bed or in a chair to work on extension     Increase activity slowly as tolerated    Complete by:  As directed      TED hose    Complete by:  As directed   Use stockings (TED hose) for 2 weeks on both leg(s).  You may remove them at night for sleeping.           Follow-up Information   Follow up with [facility], MD. Schedule an appointment as soon as possible for a visit in 2 weeks.   Specialty:  Orthopedic Surgery   Contact information:   [address] [place] [address] [phone]        Signed: Larae Grooms 05/17/2014, 7:54 AM

## 2014-05-15 NOTE — Progress Notes (Signed)
Utilization review completed.  

## 2014-05-16 LAB — BASIC METABOLIC PANEL
Anion gap: 11 (ref 5–15)
BUN: 9 mg/dL (ref 6–23)
CHLORIDE: 97 meq/L (ref 96–112)
CO2: 27 meq/L (ref 19–32)
CREATININE: 0.58 mg/dL (ref 0.50–1.10)
Calcium: 8.7 mg/dL (ref 8.4–10.5)
GFR calc Af Amer: >90 mL/min (ref >90)
GFR calc non Af Amer: >90 mL/min (ref >90)
Glucose, Bld: 112 mg/dL — ABNORMAL HIGH (ref 70–99)
Potassium: 3.8 mEq/L (ref 3.7–5.3)
Sodium: 135 mEq/L — ABNORMAL LOW (ref 137–147)

## 2014-05-16 LAB — CBC
HCT: 33.8 % — ABNORMAL LOW (ref 36.0–46.0)
HEMOGLOBIN: 10.8 g/dL — AB (ref 12.0–15.0)
MCH: 28.6 pg (ref 26.0–34.0)
MCHC: 32 g/dL (ref 30.0–36.0)
MCV: 89.7 fL (ref 78.0–100.0)
Platelets: 309 10*3/uL (ref 150–400)
RBC: 3.77 MIL/uL — AB (ref 3.87–5.11)
RDW: 14.1 % (ref 11.5–15.5)
WBC: 11.8 10*3/uL — ABNORMAL HIGH (ref 4.0–10.5)

## 2014-05-16 MED ORDER — INFLUENZA VAC SPLIT QUAD 0.5 ML IM SUSY
0.5000 mL | PREFILLED_SYRINGE | INTRAMUSCULAR | Status: AC
  Administered 2014-05-17: 0.5 mL via INTRAMUSCULAR
  Filled 2014-05-16: qty 0.5

## 2014-05-16 NOTE — Progress Notes (Signed)
Subjective: 1 Day Post-Op Procedure(s) (LRB): RIGHT TOTAL KNEE ARTHROPLASTY (Right) Patient reports pain as 3 on 0-10 scale.  No nausea/vomiting, lightheadedness/dizziness.  Positive flatus but noo bm as of yet.  Tolerating diet.    Objective: Vital signs in last 24 hours: Temp:  [97.6 F (36.4 C)-98.6 F (37 C)] 97.7 F (36.5 C) (10/29 0453) Pulse Rate:  [70-92] 70 (10/29 0453) Resp:  [10-21] 16 (10/29 0453) BP: (106-159)/(56-90) 106/56 mmHg (10/29 0453) SpO2:  [94 %-100 %] 96 % (10/29 0453) Weight:  [124.853 kg (275 lb 4 oz)] 124.853 kg (275 lb 4 oz) (10/28 8453)  Intake/Output from previous day: 10/28 0701 - 10/29 0700 In: 2596.7 [P.O.:240; I.V.:2256.7; IV Piggyback:100] Out: 895 [Urine:500; Drains:345; Blood:50] Intake/Output this shift: Total I/O In: 1256.7 [I.V.:1156.7; IV Piggyback:100] Out: 445 [Urine:300; Drains:145]   Recent Labs  05/16/14 0507  HGB 10.8*    Recent Labs  05/16/14 0507  WBC 11.8*  RBC 3.77*  HCT 33.8*  PLT 309    Recent Labs  05/16/14 0507  NA 135*  K 3.8  CL 97  CO2 27  BUN 9  CREATININE 0.58  GLUCOSE 112*  CALCIUM 8.7   No results found for this basename: LABPT, INR,  in the last 72 hours  Neurologically intact Neurovascular intact Sensation intact distally Intact pulses distally Dorsiflexion/Plantar flexion intact Compartment soft Negative homans bilaterally hemovac drain pulled by me today  Assessment/Plan: 1 Day Post-Op Procedure(s) (LRB): RIGHT TOTAL KNEE ARTHROPLASTY (Right) Advance diet Up with therapy D/C IV fluids Discharge home with home health most likely tomorrow but can go today if patient feels ready after working with PT. WBAT RLE ABLA-asymptomatic but will continue to follow  [place], M. LINDSEY 05/16/2014, 6:58 AM

## 2014-05-16 NOTE — Progress Notes (Addendum)
Physical Therapy Treatment Patient Details Name: Lindsay [name] MRN: [id number] DOB: 1956-01-18 Today's Date: 05/16/2014    History of Present Illness 58 y.o. female admitted to [facility] on 05/15/14 s/p elective R TKA.  Pt with significant PMHx of HTN, HA, CA (basal cell on shoulder), back surgery, bil foot surgery for plantarfaciitis.      PT Comments    Second session today with much improved gait distance and activity tolerance.  Pt continues to be min/min guard assist overall with RW.  Husband present for education and participating in her session.  Knee handout given and exercises reviewed.  Pt will need to practice stairs in AM for possible d/c in AM.    Follow Up Recommendations  Home health PT;Supervision for mobility/OOB     Equipment Recommendations  None recommended by PT    Recommendations for Other Services   NA     Precautions / Restrictions Precautions Precautions: Fall;Knee Precaution Booklet Issued: Yes (comment) Precaution Comments: knee handout provided and exercises reviewed.  Required Braces or Orthoses: Knee Immobilizer - Right Knee Immobilizer - Right: On when out of bed or walking Restrictions RLE Weight Bearing: Weight bearing as tolerated    Mobility  Transfers Overall transfer level: Needs assistance Equipment used: Rolling walker (2 wheeled) Transfers: Sit to/from Stand Sit to Stand: Min assist         General transfer comment: Min assist to support trunk during transitions from recliner and from [facility].  Verbal cues for safe hand and foot placement and assist needed to support trunk for balance and to power up to standing.   Ambulation/Gait Ambulation/Gait assistance: Min guard;+2 safety/equipment (husband followed with chair) Ambulation Distance (Feet): 100 Feet Assistive device: Rolling walker (2 wheeled) Gait Pattern/deviations: Step-through pattern;Antalgic Gait velocity: decreased Gait velocity interpretation: Below normal speed for  age/gender General Gait Details: Pt able to go much further with her gait during the PM session today.  She continues to report some mild lightheadedness, but able to continue.  Chair to follow helped with safety and to increase her gait distance.    Stairs Stairs:  (defer to tomorrow AM)                 Balance Overall balance assessment: Needs assistance Sitting-balance support: Feet supported;No upper extremity supported;Bilateral upper extremity supported Sitting balance-Leahy Scale: Good     Standing balance support: Bilateral upper extremity supported;No upper extremity supported Standing balance-Leahy Scale: Fair                      Cognition Arousal/Alertness: Awake/alert Behavior During Therapy: WFL for tasks assessed/performed Overall Cognitive Status: Within Functional Limits for tasks assessed                      Exercises Total Joint Exercises Ankle Circles/Pumps: AROM;Both;20 reps;Supine Quad Sets: AROM;Right;10 reps;Seated Towel Squeeze: AROM;Both;10 reps;Supine Short Arc Quad: AAROM;Right;10 reps;Supine Heel Slides: AAROM;Right;10 reps;Supine Hip ABduction/ADduction: AAROM;Right;10 reps;Supine Straight Leg Raises: AAROM;Right;10 reps;Supine Goniometric ROM: ~40 degrees AARM        Pertinent Vitals/Pain Pain Assessment: 0-10 Pain Score: 7  Pain Location: right knee Pain Descriptors / Indicators: Aching Pain Intervention(s): Limited activity within patient's tolerance;Monitored during session;Repositioned;Ice applied    Home Living Family/patient expects to be discharged to:: Private residence Living Arrangements: Spouse/significant other Available Help at Discharge: Family;Available 24 hours/day Type of Home: House Home Access: Stairs to enter Entrance Stairs-Rails: Right;Left;Can reach both Home Layout: One level Home Equipment: Gilford Rile -  2 wheels;Bedside commode (delivered to hospital)      Prior Function Level of  Independence: Independent          PT Goals (current goals can now be found in the care plan section) Acute Rehab PT Goals Patient Stated Goal: to decrease pain PT Goal Formulation: With patient Time For Goal Achievement: 05/23/14 Potential to Achieve Goals: Good    Frequency  7X/week    PT Plan Current plan remains appropriate       End of Session Equipment Utilized During Treatment: Right knee immobilizer Activity Tolerance: Patient limited by fatigue;Patient limited by pain Patient left: in chair;with call bell/phone within reach;with family/visitor present     Time: 1725-1756 PT Time Calculation (min): 31 min  Charges:  $Gait Training: 8-22 mins $Therapeutic Exercise: 8-22 mins                      Lindsay [name] B. [place], [place], DPT [phone]   05/16/2014, 6:19 PM

## 2014-05-16 NOTE — Op Note (Signed)
Lindsay [name], [name] NO.:  [id number]  MEDICAL RECORD NO.:  [id number]  LOCATION:  5N30C                        FACILITY:  [facility]  PHYSICIAN:  Ninetta Lights, M.D. DATE OF BIRTH:  04-11-1956  DATE OF PROCEDURE:  05/15/2014 DATE OF DISCHARGE:                              OPERATIVE REPORT   PREOPERATIVE DIAGNOSIS:  Right knee primary localized end-stage degenerative arthritis of most marked findings patellofemoral joint.  POSTOPERATIVE DIAGNOSIS:  Right knee primary localized end-stage degenerative arthritis of most marked findings patellofemoral joint with associated slightly greater than 1-cm, verrucous-pigmented mole that was just medial and superior to the incision but considering enough to warrant excisional biopsy.  PROCEDURE: 1. Right knee modified minimally invasive total knee replacement with     Stryker triathlon prosthesis.  Soft tissue balancing.  A cemented     pegged #4 cruciate retaining femoral component.  Cemented #5 tibial     component, 9-mm CS insert.  Cemented resurfacing 35-mm patellar     component. 2. Complete full-thickness excision of the pigmented mole elliptically     with a primary side-to-side closure.  SURGEON:  Ninetta Lights, M.D.  ASSISTANT:  Doran Stabler, PA, present throughout the entire case and necessary for timely completion of procedure.  ANESTHESIA:  General.  BLOOD LOSS:  Minimal.  SPECIMENS:  Excised mole as described above.  CULTURES:  None.  COMPLICATIONS:  None.  DRESSINGS:  Soft compressive.  DRAINS:  Hemovac x1.  TOURNIQUET TIME:  50 minutes.  DESCRIPTION OF PROCEDURE:  The patient was brought to the operating room, placed on the operating table in supine position.  After adequate anesthesia had been obtained, tourniquet applied.  Prepped and draped in usual sterile fashion.  Exsanguinated with elevation of Esmarch. Tourniquet inflated to 350 mmHg.  Before opening her knee, the  pigmented irregular-shaped mole was recognized just superior medial to the incision.  She had a previous mole excised from her shoulder.  I discussed this with her husband who says that there were plans to excise this other mole as well in the future per Dermatology.  I elected to go ahead and proceed with a full-thickness excision and sent it to pathology at this time.  I felt this is the most prudent course.  This was elliptically excised in its entirety with nice margins around it.  I irrigated and then side-to-side closure with nylon.  Straight incision above the patella down the tibial tubercle.  Skin and subcutaneous tissue divided.  Medial arthrotomy, vastus splitting, preserving quad tendon.  Medial capsule released.  Significant changes present at patellofemoral joint with the patella just about concave.  Distal femur exposed.  Intramedullary guide placed, flexible rod.  An 8-mm resection, 5 degrees of valgus.  Using epicondylar axis, the femur was sized, cut, and fitted for a pegged #4 cruciate-retaining component.  Proximal tibial resection extramedullary guide, 3-degree posterior slope cut. Sized to #5 component.  Patella exposed.  Brought down to a nice flat surface with adequate bone left for the implant.  Drilled, sized, and fitted for a 35-mm component.  Trials put in place.  With a 9-mm insert, I was very pleased by mechanical axis, good patellar  tracking, nicely balanced of flexion and extension, good motion, good stability.  Tibia was marked for rotation and reamed.  Copious irrigation with a pulse irrigating device.  Cement prepared, placed on all components, firmly seated.  Polyethylene attached to tibia, knee reduced.  Patella however clamped.  Once cement hardened, the knee was irrigated again.  Soft tissues injected with Exparel.  Hemovac placed and brought through a separate stab wound.  Arthrotomy closed with #1 Vicryl.  Skin and subcutaneous tissue with the  subcutaneous subcuticular closure.  Margins were injected with Marcaine.  Sterile compressive dressing applied. Tourniquet deflated and removed.  Knee immobilizer applied.  Anesthesia reversed.  Brought to the recovery room.  Tolerated the surgery well. No complications.     Ninetta Lights, M.D.     DFM/MEDQ  D:  05/15/2014  T:  05/16/2014  Job:  [id number]

## 2014-05-16 NOTE — Evaluation (Signed)
Physical Therapy Evaluation Patient Details Name: Lindsay [name] MRN: [id number] DOB: [birth year] Today's Date: 05/16/2014   History of Present Illness  58 y.o. female admitted to [facility] on 05/15/14 s/p elective R TKA.  Pt with significant PMHx of HTN, HA, CA (basal cell on shoulder), back surgery, bil foot surgery for plantarfaciitis.    Clinical Impression  Pt is POD #1 s/p knee replacement.  She is moving well, overall min assist, but limited this AM by lightheadedness and pain.  She should progress well enough to go home with family assist and HHPT at discharge.  PT will continue to follow acutely.     Follow Up Recommendations Home health PT;Supervision for mobility/OOB    Equipment Recommendations  None recommended by PT    Recommendations for Other Services   NA    Precautions / Restrictions Precautions Precautions: Fall;Knee Precaution Booklet Issued: No Precaution Comments: educated pt on no pillow, KI use, WBAT status Required Braces or Orthoses: Knee Immobilizer - Right Knee Immobilizer - Right: On when out of bed or walking Restrictions RLE Weight Bearing: Weight bearing as tolerated      Mobility  Bed Mobility Overal bed mobility: Needs Assistance Bed Mobility: Supine to Sit     Supine to sit: Min assist     General bed mobility comments: Min assist to support leg during transition to sitting. Verbal cues for sequencing and hand placement.   Transfers Overall transfer level: Needs assistance Equipment used: Rolling walker (2 wheeled) Transfers: Sit to/from Stand Sit to Stand: Min assist         General transfer comment: Min assist from both bed and recliner chair.  Heavy reliance on upper extremity support during transitions. Min assit to support trunk for balance.  Verbal cues for safe hand placement.   Ambulation/Gait Ambulation/Gait assistance: Min assist Ambulation Distance (Feet): 25 Feet Assistive device: Rolling walker (2 wheeled) Gait  Pattern/deviations: Step-through pattern;Antalgic Gait velocity: decreased Gait velocity interpretation: Below normal speed for age/gender General Gait Details: Pt limited by lightheadedness and some nausea during gait.  Min assist for stability at trunk and verbal cues for upright posture.  RW adjusted down for height.         Balance Overall balance assessment: Needs assistance Sitting-balance support: Feet supported;Bilateral upper extremity supported;No upper extremity supported Sitting balance-Leahy Scale: Fair     Standing balance support: Bilateral upper extremity supported Standing balance-Leahy Scale: Poor                               Pertinent Vitals/Pain Pain Assessment: 0-10 Pain Score: 8  Pain Location: knee right Pain Descriptors / Indicators: Aching;Burning Pain Intervention(s): Limited activity within patient's tolerance;Monitored during session;Repositioned;Patient requesting pain meds-RN notified    Home Living Family/patient expects to be discharged to:: Private residence Living Arrangements: Spouse/significant other Available Help at Discharge: Family;Available 24 hours/day Type of Home: House Home Access: Stairs to enter Entrance Stairs-Rails: Right;Left;Can reach both Entrance Stairs-Number of Steps: 3 Home Layout: One level Home Equipment: Walker - 2 wheels;Bedside commode (delivered to hospital)      Prior Function Level of Independence: Independent                  Extremity/Trunk Assessment   Upper Extremity Assessment: Defer to OT evaluation           Lower Extremity Assessment: RLE deficits/detail RLE Deficits / Details: right leg with normal post op pain and weakness.  Ankle 3/5, knee 2/5, hip 2/5    Cervical / Trunk Assessment: Other exceptions  Communication   Communication: No difficulties  Cognition Arousal/Alertness: Awake/alert Behavior During Therapy: WFL for tasks assessed/performed Overall Cognitive  Status: Within Functional Limits for tasks assessed                         Exercises Total Joint Exercises Ankle Circles/Pumps: AROM;Both;20 reps;Supine Quad Sets: AROM;Right;10 reps;Seated Towel Squeeze: AROM;Both;10 reps;Supine Short Arc Quad: AAROM;Right;10 reps;Supine Heel Slides: AAROM;Right;10 reps;Supine Hip ABduction/ADduction: AAROM;Right;10 reps;Supine Straight Leg Raises: AAROM;Right;10 reps;Supine      Assessment/Plan    PT Assessment Patient needs continued PT services  PT Diagnosis Difficulty walking;Abnormality of gait;Generalized weakness;Acute pain   PT Problem List Decreased strength;Decreased activity tolerance;Decreased range of motion;Decreased balance;Decreased mobility;Decreased knowledge of use of DME;Decreased knowledge of precautions;Pain  PT Treatment Interventions DME instruction;Gait training;Stair training;Functional mobility training;Therapeutic activities;Therapeutic exercise;Balance training;Neuromuscular re-education;Patient/family education;Manual techniques;Modalities   PT Goals (Current goals can be found in the Care Plan section) Acute Rehab PT Goals Patient Stated Goal: to decrease pain PT Goal Formulation: With patient Time For Goal Achievement: 05/23/14 Potential to Achieve Goals: Good    Frequency 7X/week           End of Session Equipment Utilized During Treatment: Gait belt;Right knee immobilizer Activity Tolerance: Patient limited by fatigue;Patient limited by pain Patient left: in bed;with call bell/phone within reach           Time: 1029-1108 PT Time Calculation (min): 39 min   Charges:   PT Evaluation $Initial PT Evaluation Tier I: 1 Procedure PT Treatments $Gait Training: 8-22 mins $Therapeutic Exercise: 8-22 mins        Lindsay [name] B. Rew, [place], DPT [phone]   05/16/2014, 6:13 PM

## 2014-05-16 NOTE — Progress Notes (Signed)
OT Cancellation Note  Patient Details Name: Lindsay [name] MRN: [id number] DOB: October 26, 1955   Cancelled Treatment:    Reason Eval/Treat Not Completed: Pain limiting ability to participate, pt. Just returned to bed with PT. C/o sig. Pain tearful, also nausea.  Would benefit from tx this pm if time permits.    Janice Coffin, COTA/L 05/16/2014, 11:19 AM

## 2014-05-16 NOTE — Progress Notes (Signed)
Orthopedic Tech Progress Note Patient Details:  Lindsay [name] 08/20/1955 [id number]  Patient ID: [name], female   DOB: 1956/01/14, 58 y.o.   MRN: [id number] Placed pt's rle in cpm @ 0-80 degrees @1440   Hildred Priest 05/16/2014, 2:34 PM

## 2014-05-16 NOTE — Progress Notes (Signed)
Utilization review completed.  

## 2014-05-17 ENCOUNTER — Encounter (HOSPITAL_COMMUNITY): Payer: Self-pay | Admitting: Orthopedic Surgery

## 2014-05-17 LAB — BASIC METABOLIC PANEL
Anion gap: 12 (ref 5–15)
BUN: 10 mg/dL (ref 6–23)
CHLORIDE: 97 meq/L (ref 96–112)
CO2: 27 mEq/L (ref 19–32)
Calcium: 8.7 mg/dL (ref 8.4–10.5)
Creatinine, Ser: 0.69 mg/dL (ref 0.50–1.10)
GFR calc Af Amer: >90 mL/min (ref >90)
GFR calc non Af Amer: >90 mL/min (ref >90)
GLUCOSE: 109 mg/dL — AB (ref 70–99)
POTASSIUM: 3.7 meq/L (ref 3.7–5.3)
Sodium: 136 mEq/L — ABNORMAL LOW (ref 137–147)

## 2014-05-17 LAB — CBC
HEMATOCRIT: 34.3 % — AB (ref 36.0–46.0)
HEMOGLOBIN: 10.8 g/dL — AB (ref 12.0–15.0)
MCH: 28.4 pg (ref 26.0–34.0)
MCHC: 31.5 g/dL (ref 30.0–36.0)
MCV: 90.3 fL (ref 78.0–100.0)
Platelets: 278 10*3/uL (ref 150–400)
RBC: 3.8 MIL/uL — ABNORMAL LOW (ref 3.87–5.11)
RDW: 14.2 % (ref 11.5–15.5)
WBC: 10.2 10*3/uL (ref 4.0–10.5)

## 2014-05-17 NOTE — Progress Notes (Signed)
Physical Therapy Treatment Patient Details Name: Lindsay [name] MRN: [id number] DOB: 01-10-1956 Today's Date: 05/17/2014    History of Present Illness 58 y.o. female admitted to [facility] on 05/15/14 s/p elective R TKA.  Pt with significant PMHx of HTN, HA, CA (basal cell on shoulder), back surgery, bil foot surgery for plantarfaciitis.      PT Comments    Pt. Limited by fatigue but is progressing nicely.  She did well on steps and she anticipated DC home today.  Follow Up Recommendations  Home health PT;Supervision for mobility/OOB     Equipment Recommendations  None recommended by PT    Recommendations for Other Services       Precautions / Restrictions Precautions Precautions: Fall;Knee Precaution Comments: reviewed handout with exercises and precautions Required Braces or Orthoses: Knee Immobilizer - Right Knee Immobilizer - Right: On when out of bed or walking Restrictions Weight Bearing Restrictions: Yes RLE Weight Bearing: Weight bearing as tolerated    Mobility  Bed Mobility Overal bed mobility:  (not tested; pt. seated at EOB)                Transfers Overall transfer level: Needs assistance Equipment used: Rolling walker (2 wheeled) Transfers: Sit to/from Stand Sit to Stand: Supervision         General transfer comment: supervision for safety to rise to stand from bed and from recliner; good technique  Ambulation/Gait Ambulation/Gait assistance: Min guard Ambulation Distance (Feet): 130 Feet (50' then 30' with seated rest) Assistive device: Rolling walker (2 wheeled) Gait Pattern/deviations: Step-through pattern;Antalgic Gait velocity: decreased   General Gait Details: Pt. fatigues fairly quickly and needed seated rest before completion of session   Stairs Stairs: Yes Stairs assistance: Min guard Stair Management: Two rails;Step to pattern;Forwards Number of Stairs: 3 General stair comments: pt needed initial reminder for correct sequence then  able to follow through.  Min guard assist for safety to ascend and descend steps  Wheelchair Mobility    Modified Rankin (Stroke Patients Only)       Balance                                    Cognition Arousal/Alertness: Awake/alert Behavior During Therapy: WFL for tasks assessed/performed Overall Cognitive Status: Within Functional Limits for tasks assessed                      Exercises Total Joint Exercises Ankle Circles/Pumps: AROM;Both;20 reps;Seated Quad Sets: AROM;Right;10 reps;Seated Short Arc Quad: AAROM;AROM;Right;10 reps;Seated Hip ABduction/ADduction: AAROM;Right;10 reps;Seated Straight Leg Raises: AAROM;Right;10 reps Knee Flexion: AROM;5 reps;Seated Goniometric ROM: 0 to 65    General Comments        Pertinent Vitals/Pain Pain Assessment: 0-10 Pain Score: 4  Pain Location: right knee Pain Descriptors / Indicators: Aching Pain Intervention(s): Limited activity within patient's tolerance;Repositioned;Premedicated before session    Home Living                      Prior Function            PT Goals (current goals can now be found in the care plan section) Progress towards PT goals: Progressing toward goals    Frequency  7X/week    PT Plan Current plan remains appropriate    Co-evaluation             End of Session Equipment Utilized During Treatment: Right knee  immobilizer Activity Tolerance: Patient limited by fatigue Patient left: in chair;with call bell/phone within reach;with family/visitor present     Time: 3754-3606 PT Time Calculation (min): 43 min  Charges:  $Gait Training: 23-37 mins $Therapeutic Exercise: 8-22 mins                    G Codes:      Ladona Ridgel 05/17/2014, 9:52 AM Gerlean Ren PT Acute Rehab Services [phone] Beeper [phone]

## 2014-05-17 NOTE — Care Management Note (Signed)
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 Patient had a right total knee arthroplasty.     Action/Plan:   Patient preoperatively setup with [facility], no changes. Has family support at discharge.   Anticipated DC Date:  05/17/2014   Anticipated DC Plan:  [place]  CM consult      PAC Choice  [place]   Choice offered to / List presented to:     DME arranged  [facility]      DME agency  [employer]     HH arranged  HH-2 PT      [place].   Status of service:  Completed, signed off Medicare Important Message given?   (If response is "NO", the following Medicare IM given date fields will be blank) Date Medicare IM given:   Medicare IM given by:   Date Additional Medicare IM given:   Additional Medicare IM given by:    Discharge Disposition:  [place]  Per UR Regulation:  Reviewed for med. necessity/level of care/duration of stay

## 2014-05-17 NOTE — Discharge Planning (Signed)
Patient educated on discharge instructions. Patient is ready for discharge.

## 2014-05-17 NOTE — Progress Notes (Addendum)
Subjective: 2 Days Post-Op Procedure(s) (LRB): RIGHT TOTAL KNEE ARTHROPLASTY (Right) Patient reports pain as 2 on 0-10 scale.  No nausea/vomiting, lightheadedness/dizziness.  Positive flatus but no bm.  Tolerating diet and ready to go home today.  Objective: Vital signs in last 24 hours: Temp:  [98.1 F (36.7 C)-98.6 F (37 C)] 98.1 F (36.7 C) (10/30 0525) Pulse Rate:  [82-93] 83 (10/30 0525) Resp:  [16-18] 18 (10/30 0525) BP: (99-105)/(49-62) 99/56 mmHg (10/30 0525) SpO2:  [92 %-96 %] 94 % (10/30 0525)  Intake/Output from previous day: 10/29 0701 - 10/30 0700 In: 840 [P.O.:840] Out: -  Intake/Output this shift:     Recent Labs  05/16/14 0507 05/17/14 0550  HGB 10.8* 10.8*    Recent Labs  05/16/14 0507 05/17/14 0550  WBC 11.8* 10.2  RBC 3.77* 3.80*  HCT 33.8* 34.3*  PLT 309 278    Recent Labs  05/16/14 0507 05/17/14 0550  NA 135* 136*  K 3.8 3.7  CL 97 97  CO2 27 27  BUN 9 10  CREATININE 0.58 0.69  GLUCOSE 112* 109*  CALCIUM 8.7 8.7   No results found for this basename: LABPT, INR,  in the last 72 hours  Neurologically intact Neurovascular intact Sensation intact distally Intact pulses distally Dorsiflexion/Plantar flexion intact Incision: no drainage No cellulitis present Compartment soft Negative homans bilaterally Dressing changed by me today  Assessment/Plan: 2 Days Post-Op Procedure(s) (LRB): RIGHT TOTAL KNEE ARTHROPLASTY (Right) Advance diet Up with therapy Discharge home with home health WBAT RLE Please place ted hose RLE prior to d/c  ANTON, M. LINDSEY 05/17/2014, 7:52 AM

## 2014-11-12 ENCOUNTER — Telehealth (HOSPITAL_COMMUNITY): Payer: Self-pay | Admitting: *Deleted

## 2014-11-20 ENCOUNTER — Telehealth (HOSPITAL_COMMUNITY): Payer: Self-pay | Admitting: *Deleted

## 2014-11-29 ENCOUNTER — Other Ambulatory Visit (HOSPITAL_COMMUNITY): Payer: Self-pay | Admitting: Orthopedic Surgery

## 2014-11-29 DIAGNOSIS — M79605 Pain in left leg: Principal | ICD-10-CM

## 2014-11-29 DIAGNOSIS — M79604 Pain in right leg: Secondary | ICD-10-CM

## 2014-12-03 ENCOUNTER — Encounter (HOSPITAL_COMMUNITY): Payer: Managed Care, Other (non HMO)

## 2018-05-10 ENCOUNTER — Other Ambulatory Visit: Payer: Self-pay | Admitting: Adult Health

## 2018-05-10 DIAGNOSIS — N6489 Other specified disorders of breast: Secondary | ICD-10-CM

## 2018-05-16 ENCOUNTER — Ambulatory Visit
Admission: RE | Admit: 2018-05-16 | Discharge: 2018-05-16 | Disposition: A | Discharge status: Home / Self Care | Payer: PRIVATE HEALTH INSURANCE | Source: Ambulatory Visit | Attending: Adult Health | Admitting: Adult Health

## 2018-05-16 ENCOUNTER — Other Ambulatory Visit: Payer: Self-pay | Admitting: Adult Health

## 2018-05-16 DIAGNOSIS — N6489 Other specified disorders of breast: Secondary | ICD-10-CM

## 2020-06-22 IMAGING — MG MM CLIP PLACEMENT
4 series · 4 of 12 positions shown · non-contrast
Comparison: Previous exam(s).

CLINICAL DATA: Post stereotactic core needle biopsy of left upper
central breast distortion.

EXAM:
DIAGNOSTIC LEFT MAMMOGRAM POST STEREOTACTIC BIOPSY

[L CC synth-2D]
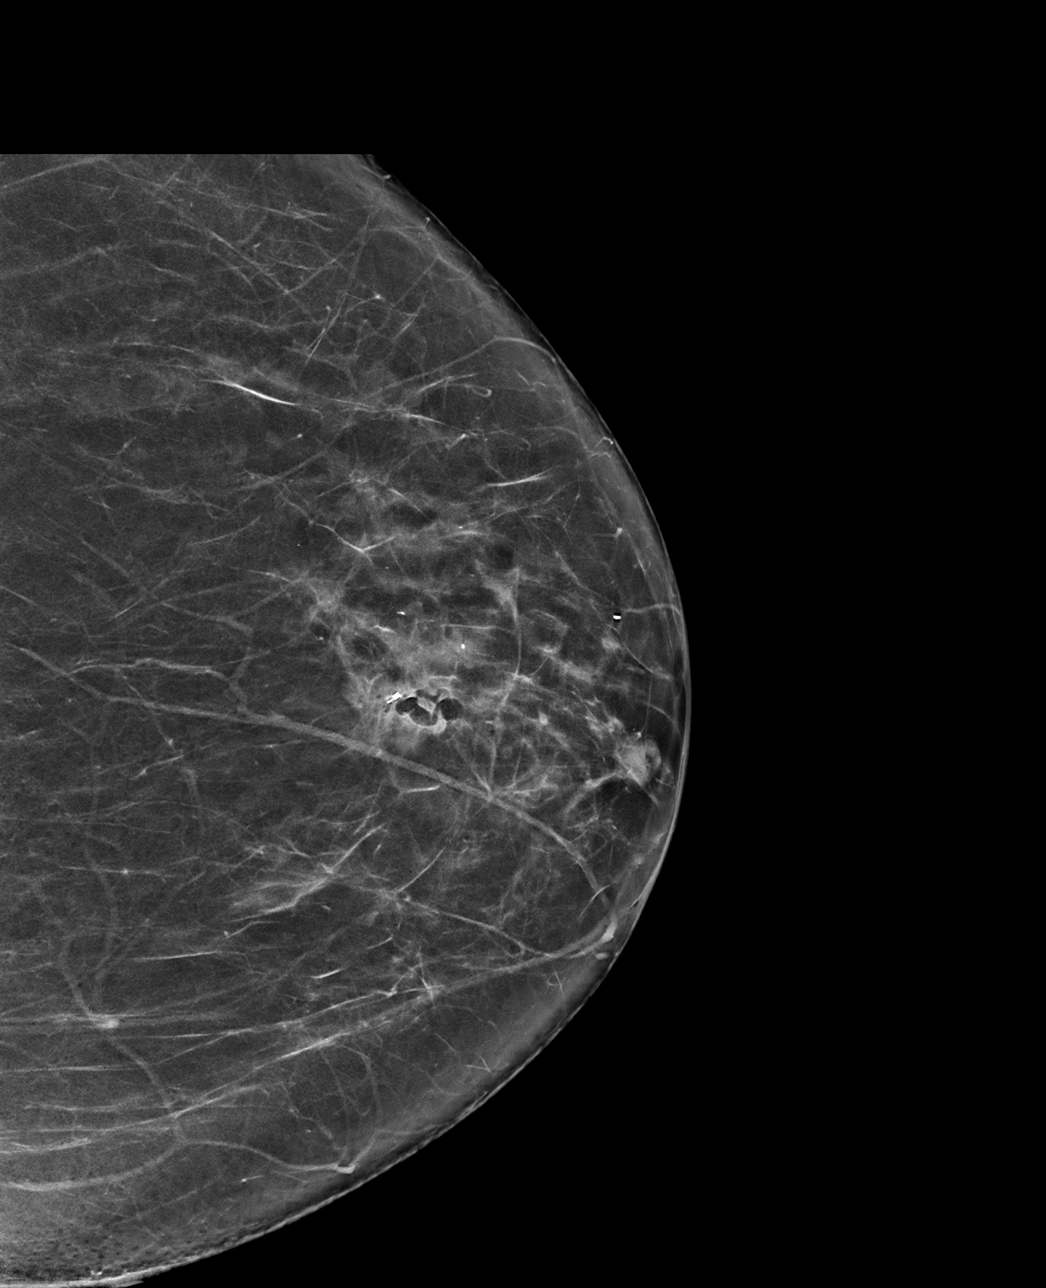

[L ML synth-2D]
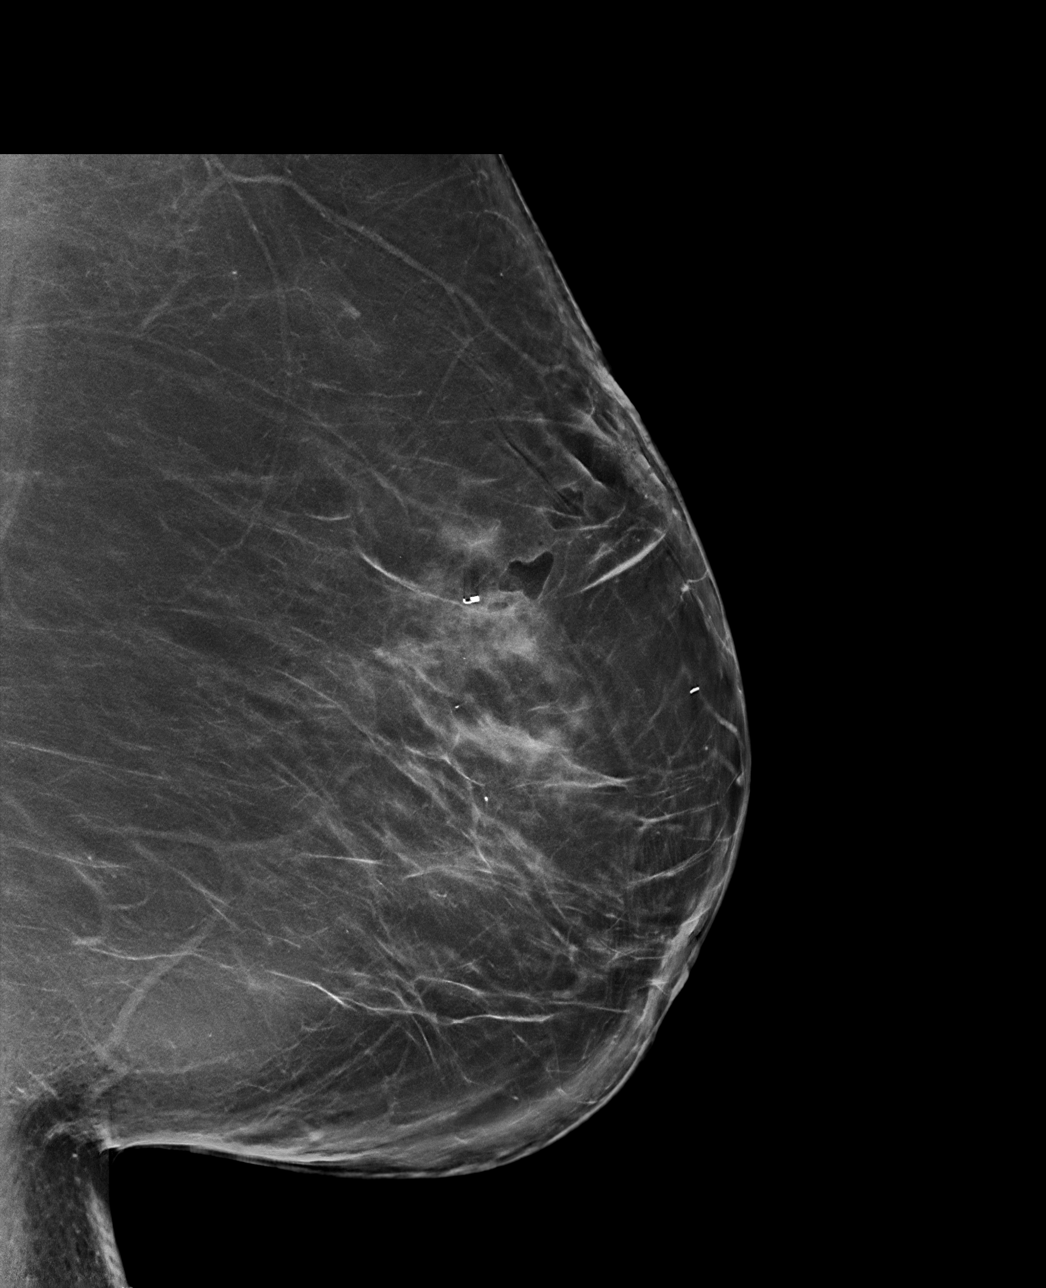

[L ML tomo · tomo slice 49/97.0]
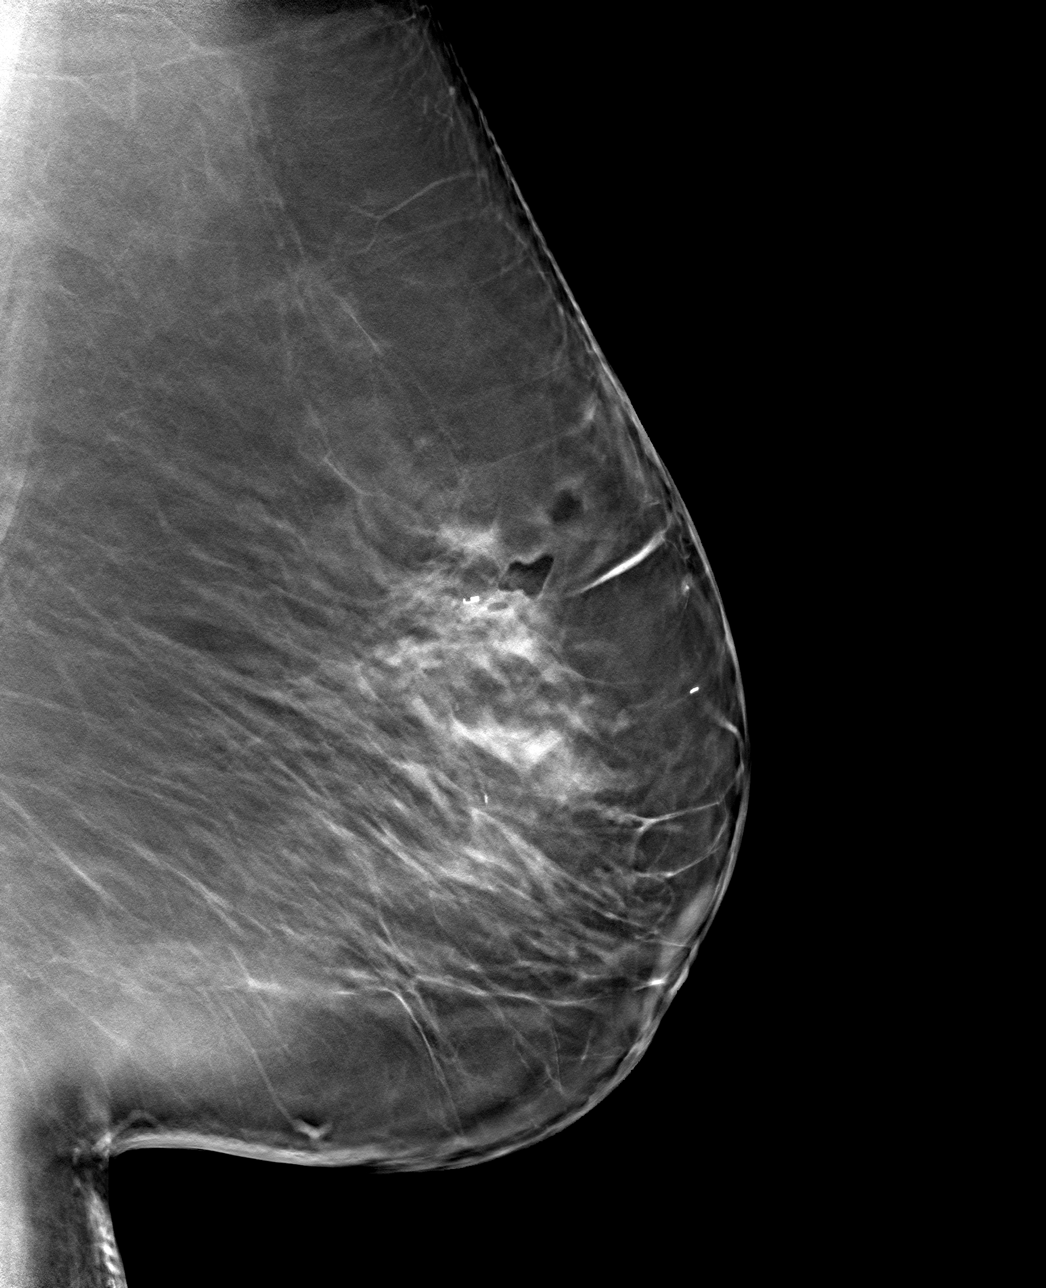

[L CC tomo · tomo slice 39/76.0]
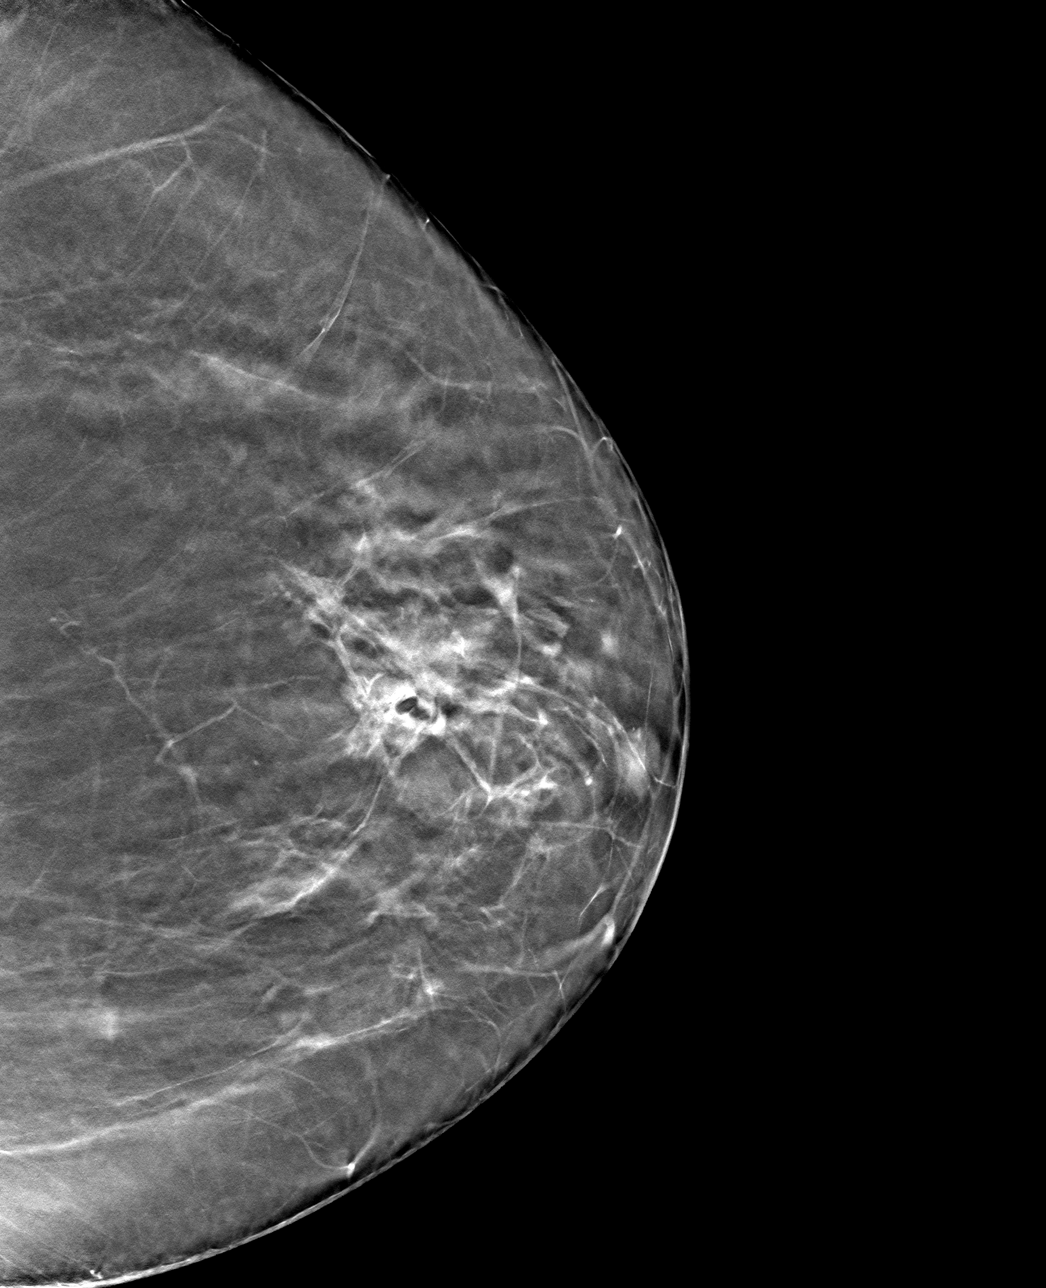

[4 of 12 positions shown; findings below may reference images not displayed]

FINDINGS: Mammographic images were obtained following stereotactic guided
biopsy of left upper central breast distortion. Two-view mammography
demonstrates presence of coil shaped marker in appropriate
mammographic position within the sampled distortion. Expected post
biopsy changes are seen.
IMPRESSION: Successful placement of coil shaped marker post stereotactic core
needle biopsy of the left breast.

Final Assessment: Post Procedure Mammograms for Marker Placement
